# Patient Record
Sex: Male | Born: 1990 | Race: White | Hispanic: No | Marital: Single | Smoking: Current every day smoker
Health system: Southern US, Community
[De-identification: ages and names within clinical notes are randomized; demographics above are authoritative.]

## PROBLEM LIST (undated history)

## (undated) HISTORY — PX: APPENDECTOMY: SHX54

---
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
## 2012-04-13 ENCOUNTER — Emergency Department: Payer: Self-pay | Admitting: Emergency Medicine

## 2012-04-22 ENCOUNTER — Emergency Department: Payer: Self-pay | Admitting: Emergency Medicine

## 2012-05-25 ENCOUNTER — Emergency Department: Payer: Self-pay | Admitting: Emergency Medicine

## 2012-05-27 ENCOUNTER — Emergency Department: Payer: Self-pay | Admitting: Emergency Medicine

## 2012-12-10 ENCOUNTER — Emergency Department: Payer: Self-pay | Admitting: Emergency Medicine

## 2012-12-14 LAB — WOUND CULTURE

## 2013-08-21 ENCOUNTER — Emergency Department: Payer: Self-pay | Admitting: Emergency Medicine

## 2013-09-01 ENCOUNTER — Emergency Department: Payer: Self-pay | Admitting: Internal Medicine

## 2015-10-24 ENCOUNTER — Emergency Department
Admission: EM | Admit: 2015-10-24 | Discharge: 2015-10-24 | Disposition: A | Discharge status: Home / Self Care | Payer: Self-pay | Attending: Emergency Medicine | Admitting: Emergency Medicine

## 2015-10-24 ENCOUNTER — Encounter: Payer: Self-pay | Admitting: Emergency Medicine

## 2015-10-24 DIAGNOSIS — R55 Syncope and collapse: Secondary | ICD-10-CM | POA: Insufficient documentation

## 2015-10-24 DIAGNOSIS — F17210 Nicotine dependence, cigarettes, uncomplicated: Secondary | ICD-10-CM | POA: Insufficient documentation

## 2015-10-24 LAB — CBC
HEMATOCRIT: 48.2 % (ref 40.0–52.0)
Hemoglobin: 16.4 g/dL (ref 13.0–18.0)
MCH: 33.9 pg (ref 26.0–34.0)
MCHC: 34.1 g/dL (ref 32.0–36.0)
MCV: 99.4 fL (ref 80.0–100.0)
PLATELETS: 202 10*3/uL (ref 150–440)
RBC: 4.85 MIL/uL (ref 4.40–5.90)
RDW: 12.3 % (ref 11.5–14.5)
WBC: 9.4 10*3/uL (ref 3.8–10.6)

## 2015-10-24 LAB — BASIC METABOLIC PANEL
Anion gap: 9 (ref 5–15)
BUN: 9 mg/dL (ref 6–20)
CHLORIDE: 106 mmol/L (ref 101–111)
CO2: 25 mmol/L (ref 22–32)
CREATININE: 0.8 mg/dL (ref 0.61–1.24)
Calcium: 9.4 mg/dL (ref 8.9–10.3)
GFR calc non Af Amer: >60 mL/min (ref >60)
Glucose, Bld: 110 mg/dL — ABNORMAL HIGH (ref 65–99)
Potassium: 3.7 mmol/L (ref 3.5–5.1)
SODIUM: 140 mmol/L (ref 135–145)

## 2015-10-24 LAB — GLUCOSE, CAPILLARY: GLUCOSE-CAPILLARY: 129 mg/dL — AB (ref 65–99)

## 2015-10-24 MED ORDER — SODIUM CHLORIDE 0.9 % IV BOLUS (SEPSIS)
1000.0000 mL | Freq: Once | INTRAVENOUS | Status: AC
  Administered 2015-10-24: 1000 mL via INTRAVENOUS

## 2015-10-24 NOTE — ED Triage Notes (Signed)
Pt states that he was at work today and started having blackouts where he was consciously doing something and then woke up doing something else. Does not remember changing over to something else.  Denies N/V/D nor headache.  Feels that it may be some anxiety due to the fact that he is letting things bother him more now then he used to.

## 2015-10-24 NOTE — ED Notes (Addendum)
Patient A&Ox4.

## 2015-10-24 NOTE — ED Notes (Signed)
During triage while after trying to take blood, pt had an immediate onset of nausea. Eyes then rolled back into patient's head and he went out cold.  Hands drew up and pt's body started shaking with seizure-like activity.

## 2015-10-24 NOTE — ED Notes (Signed)
Able to obtain IV access with no complications. Patient denies any syncopal episodes during or immediatly after blood draw.

## 2015-10-24 NOTE — ED Provider Notes (Signed)
[facility] Emergency Department Provider Note   ____________________________________________   First MD Initiated Contact with Patient 10/24/15 1208     (approximate)  I have reviewed the triage vital signs and the nursing notes.   HISTORY  Chief Complaint Near Syncope    HPI Darren Hutchinson is a 25 y.o. male reports that while at work, he had an episode where he was standing and seemingly blacked out briefly, but he did not fall or injure himself. 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 Reports he felt like he was going to but did not completely pass out. He then felt better, but it forces never happened to him before prompting coming to the ER. We'll have his blood drawn he reported that he began having the same symptoms again, recently.  No headache, chest pain, fall or injury. No nausea or vomiting. Denies family history of sudden cardiac death. No leg swelling. No recent long trips or travels.   History reviewed. No pertinent past medical history.  There are no active problems to display for this patient.   Past Surgical History:  Procedure Laterality Date  . APPENDECTOMY      Prior to Admission medications   Not on File  None  Allergies Review of patient's allergies indicates no known allergies.  History reviewed. No pertinent family history.  Social History Social History  Substance Use Topics  . Smoking status: Current Every Day Smoker    Packs/day: 0.50    Types: Cigarettes  . Smokeless tobacco: Former Neurosurgeon  . Alcohol use 3.6 oz/week    6 Cans of beer per week     Comment: per day    Review of Systems Constitutional: No fever/chills Eyes: No visual changes. ENT: No sore  throat. Cardiovascular: Denies chest pain. Respiratory: Denies shortness of breath. Gastrointestinal: No abdominal pain.  No nausea, no vomiting.  No diarrhea.  No constipation. Genitourinary: Negative for dysuria. Musculoskeletal: Negative for back pain. Skin: Negative for rash. Neurological: Negative for headaches, focal weakness or numbness.  Patient reports he feels fine here now.  10-point ROS otherwise negative.  ____________________________________________   PHYSICAL EXAM:  VITAL SIGNS: ED Triage Vitals [10/24/15 1125]  Enc Vitals Group     BP (!) 148/99     Pulse Rate 96     Resp 16     Temp 98 F (36.7 C)     Temp src      SpO2 98 %     Weight 150 lb (68 kg)     Height 6' (1.829 m)     Head Circumference      Peak Flow      Pain Score      Pain Loc      Pain Edu?      Excl. in GC?     Constitutional: Alert and oriented. Well appearing and in no acute distress. Eyes: Conjunctivae are normal. PERRL. EOMI. Head: Atraumatic. Nose: No congestion/rhinnorhea. Mouth/Throat: Mucous membranes are moist.  Oropharynx non-erythematous. Neck: No stridor.   Cardiovascular: Normal rate, regular rhythm. Grossly normal heart sounds.  Good peripheral circulation. Respiratory: Normal respiratory effort.  No retractions. Lungs CTAB. Gastrointestinal: Soft and nontender. No distention. No abdominal bruits.  No CVA tenderness. Musculoskeletal: No lower extremity tenderness nor edema.  No joint effusions. Neurologic:  Normal speech and language. No gross focal neurologic deficits are appreciated. No gait instability. Skin:  Skin is warm, dry and intact. No rash noted. Psychiatric: Mood and affect are normal. Speech and behavior are normal.  ____________________________________________   LABS (all labs ordered are listed, but only abnormal results are displayed)  Labs Reviewed  BASIC METABOLIC PANEL  CBC  CBG MONITORING, ED    ____________________________________________  EKG  Reviewed and interpreted by me at 1150 Heart rate 70 Care is 100 QTc 420 Normal sinus rhythm, incomplete right bundle-branch block. No evidence of WPW, Brugada, or prolonged QT syndrome. ____________________________________________  RADIOLOGY   ____________________________________________   PROCEDURES  Procedure(s) performed: None  Procedures  Critical Care performed: No  ____________________________________________   INITIAL IMPRESSION / ASSESSMENT AND PLAN / ED COURSE  Pertinent labs & imaging results that were available during my care of the patient were reviewed by me and considered in my medical decision making (see chart for details).  Patient transfer evaluation of an episode of near-syncope. Also had a second episode while having blood drawn, brief witnessed with full recovery of mental status within a moment. No seizure activity, though did express a slight amount of shaking when he had almost passed out at triage. Completely awake and alert neurologically intact. History given seems indicated the patient may be slightly dehydrated, likely vasovagal given he stands for prolonged periods when this occurred. No evidence of acute cardiac or pulmonary disease. No evidence of arrhythmia or palpitations.  ----------------------------------------- 1:28 PM on 10/24/2015 -----------------------------------------  Discussed with the patient his results. He is currently sitting up, ambulating, feels well drinking fluids. Return precautions and treatment recommendations and follow-up discussed with the patient who is agreeable with the plan.   Clinical Course     ____________________________________________   FINAL CLINICAL IMPRESSION(S) / ED DIAGNOSES  Final diagnoses:  Vasovagal near-syncope      NEW MEDICATIONS STARTED DURING THIS VISIT:  New Prescriptions   No medications on file     Note:  This  document was prepared using Dragon voice recognition software and may include unintentional dictation errors.     Sharyn CreamerMark Jahzir Strohmeier, MD 10/24/15 1330

## 2015-10-24 NOTE — Discharge Instructions (Signed)

## 2018-10-22 ENCOUNTER — Emergency Department
Admission: EM | Admit: 2018-10-22 | Discharge: 2018-10-22 | Disposition: A | Discharge status: Home / Self Care | Payer: Self-pay | Attending: Student | Admitting: Student

## 2018-10-22 ENCOUNTER — Encounter: Payer: Self-pay | Admitting: Emergency Medicine

## 2018-10-22 ENCOUNTER — Other Ambulatory Visit: Payer: Self-pay

## 2018-10-22 ENCOUNTER — Emergency Department: Payer: Self-pay

## 2018-10-22 DIAGNOSIS — L0201 Cutaneous abscess of face: Secondary | ICD-10-CM | POA: Insufficient documentation

## 2018-10-22 DIAGNOSIS — F17210 Nicotine dependence, cigarettes, uncomplicated: Secondary | ICD-10-CM | POA: Insufficient documentation

## 2018-10-22 DIAGNOSIS — L0291 Cutaneous abscess, unspecified: Secondary | ICD-10-CM

## 2018-10-22 LAB — CBC WITH DIFFERENTIAL/PLATELET
Abs Immature Granulocytes: 0.02 10*3/uL (ref 0.00–0.07)
Basophils Absolute: 0.1 10*3/uL (ref 0.0–0.1)
Basophils Relative: 1 %
Eosinophils Absolute: 0.3 10*3/uL (ref 0.0–0.5)
Eosinophils Relative: 3 %
HCT: 47.3 % (ref 39.0–52.0)
Hemoglobin: 16.3 g/dL (ref 13.0–17.0)
Immature Granulocytes: 0 %
Lymphocytes Relative: 14 %
Lymphs Abs: 1.5 10*3/uL (ref 0.7–4.0)
MCH: 34.1 pg — ABNORMAL HIGH (ref 26.0–34.0)
MCHC: 34.5 g/dL (ref 30.0–36.0)
MCV: 99 fL (ref 80.0–100.0)
Monocytes Absolute: 0.7 10*3/uL (ref 0.1–1.0)
Monocytes Relative: 6 %
Neutro Abs: 8.1 10*3/uL — ABNORMAL HIGH (ref 1.7–7.7)
Neutrophils Relative %: 76 %
Platelets: 239 10*3/uL (ref 150–400)
RBC: 4.78 MIL/uL (ref 4.22–5.81)
RDW: 11.8 % (ref 11.5–15.5)
WBC: 10.6 10*3/uL — ABNORMAL HIGH (ref 4.0–10.5)
nRBC: 0 % (ref 0.0–0.2)

## 2018-10-22 LAB — COMPREHENSIVE METABOLIC PANEL
ALT: 19 U/L (ref 0–44)
AST: 22 U/L (ref 15–41)
Albumin: 5 g/dL (ref 3.5–5.0)
Alkaline Phosphatase: 73 U/L (ref 38–126)
Anion gap: 13 (ref 5–15)
BUN: 13 mg/dL (ref 6–20)
CO2: 24 mmol/L (ref 22–32)
Calcium: 10 mg/dL (ref 8.9–10.3)
Chloride: 105 mmol/L (ref 98–111)
Creatinine, Ser: 0.77 mg/dL (ref 0.61–1.24)
GFR calc Af Amer: >60 mL/min (ref >60)
GFR calc non Af Amer: >60 mL/min (ref >60)
Glucose, Bld: 105 mg/dL — ABNORMAL HIGH (ref 70–99)
Potassium: 3.9 mmol/L (ref 3.5–5.1)
Sodium: 142 mmol/L (ref 135–145)
Total Bilirubin: 1 mg/dL (ref 0.3–1.2)
Total Protein: 8.6 g/dL — ABNORMAL HIGH (ref 6.5–8.1)

## 2018-10-22 LAB — LACTIC ACID, PLASMA: Lactic Acid, Venous: 1.7 mmol/L (ref 0.5–1.9)

## 2018-10-22 MED ORDER — LIDOCAINE-EPINEPHRINE 2 %-1:100000 IJ SOLN
20.0000 mL | Freq: Once | INTRAMUSCULAR | Status: AC
  Administered 2018-10-22: 20 mL via INTRADERMAL
  Filled 2018-10-22: qty 1

## 2018-10-22 MED ORDER — SULFAMETHOXAZOLE-TRIMETHOPRIM 800-160 MG PO TABS: 1.0000 | ORAL_TABLET | Freq: Two times a day (BID) | ORAL | 0 refills | Status: AC

## 2018-10-22 MED ORDER — IOHEXOL 300 MG/ML  SOLN
75.0000 mL | Freq: Once | INTRAMUSCULAR | Status: AC | PRN
  Administered 2018-10-22: 75 mL via INTRAVENOUS

## 2018-10-22 MED ORDER — SULFAMETHOXAZOLE-TRIMETHOPRIM 800-160 MG PO TABS
1.0000 | ORAL_TABLET | Freq: Once | ORAL | Status: AC
  Administered 2018-10-22: 1 via ORAL
  Filled 2018-10-22: qty 1

## 2018-10-22 NOTE — Discharge Instructions (Addendum)
Thank you for letting us take care of you in the ER today.  Please leave the packing in for 48 hours. After this, let warm water run over it and pull out the packing.   Take your antibiotics as directed.  Please follow-up with your primary care doctor, in urgent care, or return here for reevaluation in 48 to 72 hours to make sure that your wound is healing appropriately.  Please return to the emergency department for any new or worsening symptoms.

## 2018-10-22 NOTE — ED Provider Notes (Signed)
[facility] Emergency Department Provider Note  ____________________________________________   First MD Initiated Contact with Patient 10/22/18 2157     (approximate)  I have reviewed the triage vital signs and the nursing notes.  History  Chief Complaint Abscess    HPI Darren Hutchinson is a 28 y.o. male who presents emergency department for an abscess to his left jawline.  He reports a history of abscesses requiring drainage in the past.  He is unsure of exactly how long it has been there for, he really only noticed it to be bothersome today.  He reports associated moderate pain.  He denies any difficulty speaking or swallowing.  There is been no drainage from the area.         Past Medical Hx History reviewed. No pertinent past medical history.  Problem List There are no active problems to display for this patient.   Past Surgical Hx Past Surgical History:  Procedure Laterality Date  . APPENDECTOMY      Medications Prior to Admission medications   Not on File    Allergies Patient has no known allergies.  Family Hx No family history on file.  Social Hx Social History   Tobacco Use  . Smoking status: Current Every Day Smoker    Packs/day: 0.50    Types: Cigarettes  . Smokeless tobacco: Former Network engineer Use Topics  . Alcohol use: Yes    Alcohol/week: 6.0 standard drinks    Types: 6 Cans of beer per week    Comment: per day  . Drug use: Yes    Frequency: 2.0 times per week    Types: Marijuana     Review of Systems  Constitutional: Negative for fever. Negative for chills. Eyes: Negative for visual changes. ENT: Negative for sore throat. Cardiovascular: Negative for chest pain. Respiratory: Negative for shortness of breath. Gastrointestinal: Negative for abdominal pain. Negative for nausea. Negative for vomiting. Genitourinary: Negative for dysuria. Musculoskeletal: Negative for leg swelling. Skin: + abscess  Neurological: Negative for for headaches.   Physical Exam  Vital Signs: ED Triage Vitals [10/22/18 1945]  Enc Vitals Group     BP (!) 188/100     Pulse Rate (!) 114     Resp 18     Temp 98.6 F (37 C)     Temp Source Oral     SpO2 100 %     Weight 150 lb (68 kg)     Height 6' (1.829 m)     Head Circumference      Peak Flow      Pain Score 4     Pain Loc      Pain Edu?      Excl. in [place]?     Constitutional: Alert and oriented.  Eyes: Conjunctivae clear. Sclera anicteric. Head: Normocephalic. Atraumatic. Nose: No congestion. No rhinorrhea. Mouth/Throat: Mucous membranes are moist. Poor dentition. Neck: ~ 2 x 3 cm very superficial abscess just below the angle of the mandible on the left.  Erythematous, fluctuant, warm, and tender to touch. Cardiovascular: Tachycardic, regular rhythm. . Extremities well perfused. Respiratory: Normal respiratory effort.   Gastrointestinal: No distention.  Musculoskeletal: No lower extremity edema. Neurologic:  Normal speech and language. No gross focal neurologic deficits are appreciated.  Skin: Skin is warm, dry and intact. No rash noted. Psychiatric: Mood and affect are appropriate for situation.  EKG  N/A    Radiology  CT: IMPRESSION: 1. Positive for a superficial 20 x 13 x  30 mm subcutaneous abscess, adjacent to but separate from the inferior pole of the left parotid. This overlies the left external jugular vein which remains patent. Nearby left mandible is intact. Reactive appearing left neck lymph nodes. 2. Acute bilateral paranasal sinus inflammation which appears unrelated to #1. 3. Carious dentition.   Procedures  Procedure(s) performed (including critical care):  Marland Kitchen.Marland Kitchen.Incision and Drainage  Date/Time: 10/23/2018 12:26 AM Performed by: Miguel AschoffMonks,  L., MD Authorized by: Miguel AschoffMonks,  L., MD   Consent:    Consent obtained:  Verbal   Consent given by:  Patient   Risks discussed:  Bleeding, incomplete drainage,  pain, infection and damage to other organs Location:    Type:  Abscess   Size:  2x3 cm   Location:  Head   Head/neck location: below angle of mandible. Pre-procedure details:    Skin preparation:  Betadine Anesthesia (see MAR for exact dosages):    Anesthesia method:  Local infiltration   Local anesthetic:  Lidocaine 1% WITH epi Procedure type:    Complexity:  Complex Procedure details:    Incision types:  Stab incision   Scalpel blade:  11   Wound management:  Irrigated with saline and extensive cleaning   Drainage:  Purulent   Drainage amount:  Copious   Packing materials:  1/4 in iodoform gauze Post-procedure details:    Patient tolerance of procedure:  Tolerated well, no immediate complications     Initial Impression / Assessment and Plan / ED Course  28 y.o. male who presents to the ED for abscess as above.  Imaging consistent with superficial abscess, separate from parotid and no involvement of the external jugular vein.  We will plan for careful I&D and course of oral antibiotics.  Patient is agreeable.  Patient tolerated I&D well, see procedure note.  Packing placed, with instructions for removal in 48 hours.  Discussed wound care as well as return precautions.  Advise recheck in 48 to 72 hours.  Course of oral antibiotics.  Patient voices understanding is comfortable plan and discharge.   Final Clinical Impression(s) / ED Diagnosis  Final diagnoses:  Abscess       Note:  This document was prepared using Dragon voice recognition software and may include unintentional dictation errors.   Miguel AschoffMonks,  L., MD 10/23/18 [id number]

## 2018-10-22 NOTE — ED Triage Notes (Signed)
Patient ambulatory to triage with steady gait, without difficulty or distress noted, mask in place; pt reports since Tuesday has had abscess to left jawline/neck by beard hairline; st hx of same

## 2018-10-27 LAB — CULTURE, BLOOD (ROUTINE X 2)
Culture: NO GROWTH
Culture: NO GROWTH
Special Requests: ADEQUATE
Special Requests: ADEQUATE

## 2019-07-12 ENCOUNTER — Emergency Department
Admission: EM | Admit: 2019-07-12 | Discharge: 2019-07-12 | Disposition: A | Discharge status: Home / Self Care | Payer: Self-pay | Attending: Student in an Organized Health Care Education/Training Program | Admitting: Student in an Organized Health Care Education/Training Program

## 2019-07-12 ENCOUNTER — Encounter: Payer: Self-pay | Admitting: Emergency Medicine

## 2019-07-12 ENCOUNTER — Other Ambulatory Visit: Payer: Self-pay

## 2019-07-12 ENCOUNTER — Emergency Department: Payer: Self-pay

## 2019-07-12 DIAGNOSIS — S6991XA Unspecified injury of right wrist, hand and finger(s), initial encounter: Secondary | ICD-10-CM | POA: Insufficient documentation

## 2019-07-12 DIAGNOSIS — W2209XA Striking against other stationary object, initial encounter: Secondary | ICD-10-CM | POA: Insufficient documentation

## 2019-07-12 DIAGNOSIS — Y939 Activity, unspecified: Secondary | ICD-10-CM | POA: Insufficient documentation

## 2019-07-12 DIAGNOSIS — Y929 Unspecified place or not applicable: Secondary | ICD-10-CM | POA: Insufficient documentation

## 2019-07-12 DIAGNOSIS — F17210 Nicotine dependence, cigarettes, uncomplicated: Secondary | ICD-10-CM | POA: Insufficient documentation

## 2019-07-12 DIAGNOSIS — Y999 Unspecified external cause status: Secondary | ICD-10-CM | POA: Insufficient documentation

## 2019-07-12 MED ORDER — IBUPROFEN 600 MG PO TABS
600.0000 mg | ORAL_TABLET | Freq: Four times a day (QID) | ORAL | 0 refills | Status: DC | PRN
Start: 2019-07-12 — End: 2020-05-29

## 2019-07-12 NOTE — ED Triage Notes (Signed)
Right hand injury after punching

## 2019-07-12 NOTE — ED Provider Notes (Signed)
[facility] Emergency Department Provider Note  ____________________________________________  Time seen: Approximately 2:43 PM  I have reviewed the triage vital signs and the nursing notes.   HISTORY  Chief Complaint Hand Injury    HPI Darren Hutchinson is a 29 y.o. male that presents to the emergency department for evaluation of right hand pain after punching a wall last night.  No additional injuries.  He has broken his hand before.   History reviewed. No pertinent past medical history.  There are no problems to display for this patient.   Past Surgical History:  Procedure Laterality Date  . APPENDECTOMY      Prior to Admission medications   Medication Sig Start Date End Date Taking? Authorizing Provider  ibuprofen (ADVIL) 600 MG tablet Take 1 tablet (600 mg total) by mouth every 6 (six) hours as needed. 07/12/19   Enid Derry, PA-C    Allergies Patient has no known allergies.  No family history on file.  Social History Social History   Tobacco Use  . Smoking status: Current Every Day Smoker    Packs/day: 0.50    Types: Cigarettes  . Smokeless tobacco: Former Engineer, water Use Topics  . Alcohol use: Yes    Alcohol/week: 6.0 standard drinks    Types: 6 Cans of beer per week    Comment: per day  . Drug use: Yes    Frequency: 2.0 times per week    Types: Marijuana     Review of Systems  Respiratory: No SOB. Gastrointestinal: No abdominal pain.  No nausea, no vomiting.  Musculoskeletal: Positive for hand pain. Skin: Negative for rash, abrasions, lacerations, ecchymosis. Neurological: Negative for headaches, numbness or tingling   ____________________________________________   PHYSICAL EXAM:  VITAL SIGNS: ED Triage Vitals  Enc Vitals Group     BP 07/12/19 1353 122/70     Pulse Rate 07/12/19 1353 78     Resp 07/12/19 1353 18     Temp 07/12/19 1353 98 F (36.7 C)     Temp Source 07/12/19 1353 Oral     SpO2 07/12/19 1353  99 %     Weight 07/12/19 1340 149 lb 14.6 oz (68 kg)     Height 07/12/19 1340 6' (1.829 m)     Head Circumference --      Peak Flow --      Pain Score 07/12/19 1340 8     Pain Loc --      Pain Edu? --      Excl. in GC? --      Constitutional: Alert and oriented. Well appearing and in no acute distress. Eyes: Conjunctivae are normal. PERRL. EOMI. Head: Atraumatic. ENT:      Ears:      Nose: No congestion/rhinnorhea.      Mouth/Throat: Mucous membranes are moist.  Neck: No stridor.  Cardiovascular: Normal rate, regular rhythm.  Good peripheral circulation.  Symmetric radial pulses bilaterally. Respiratory: Normal respiratory effort without tachypnea or retractions. Lungs CTAB. Good air entry to the bases with no decreased or absent breath sounds. Musculoskeletal: Full range of motion to all extremities. No gross deformities appreciated.  Mild swelling to right dorsal hand.  Full range of motion of fingers. Neurologic:  Normal speech and language. No gross focal neurologic deficits are appreciated.  Skin:  Skin is warm, dry and intact. No rash noted. Psychiatric: Mood and affect are normal. Speech and behavior are normal. Patient exhibits appropriate insight and judgement.   ____________________________________________  LABS (all labs ordered are listed, but only abnormal results are displayed)  Labs Reviewed - No data to display ____________________________________________  EKG   ____________________________________________  RADIOLOGY Robinette Haines, personally viewed and evaluated these images (plain radiographs) as part of my medical decision making, as well as reviewing the written report by the radiologist.  DG Hand Complete Right  Result Date: 07/12/2019 CLINICAL DATA:  Pain after hitting metal pole EXAM: RIGHT HAND - COMPLETE 3+ VIEW COMPARISON:  None. FINDINGS: Frontal, oblique, and lateral views were obtained. There is an old healed fracture of the fifth  metacarpal with remodeling. No acute fracture or dislocation. Joint spaces appear normal. No erosive change. IMPRESSION: Prior fracture of the distal fifth metacarpal, healed with remodeling. No acute fracture or dislocation. Joint spaces appear normal. No erosive change. Electronically Signed   By: Lowella Grip III M.D.   On: 07/12/2019 14:31    ____________________________________________    PROCEDURES  Procedure(s) performed:    Procedures    Medications - No data to display   ____________________________________________   INITIAL IMPRESSION / ASSESSMENT AND PLAN / ED COURSE  Pertinent labs & imaging results that were available during my care of the patient were reviewed by me and considered in my medical decision making (see chart for details).  Review of the [place] CSRS was performed in accordance of the [place] prior to dispensing any controlled drugs.     Patient presented to emergency department for evaluation of hand injury.  Vital signs and exam are reassuring.  X-ray negative for acute bony abnormalities.  Wrist splint was given.  Patient left prior to discharge.    Darren Hutchinson was evaluated in Emergency Department on 07/12/2019 for the symptoms described in the history of present illness. He was evaluated in the context of the global COVID-19 pandemic, which necessitated consideration that the patient might be at risk for infection with the SARS-CoV-2 virus that causes COVID-19. Institutional protocols and algorithms that pertain to the evaluation of patients at risk for COVID-19 are in a state of rapid change based on information released by regulatory bodies including the CDC and federal and state organizations. These policies and algorithms were followed during the patient's care in the ED.   ____________________________________________  FINAL CLINICAL IMPRESSION(S) / ED DIAGNOSES  Final diagnoses:  Injury of right hand, initial encounter      NEW  MEDICATIONS STARTED DURING THIS VISIT:  ED Discharge Orders         Ordered    ibuprofen (ADVIL) 600 MG tablet  Every 6 hours PRN     07/12/19 1503              This chart was dictated using voice recognition software/Dragon. Despite best efforts to proofread, errors can occur which can change the meaning. Any change was purely unintentional.    Laban Emperor, PA-C 07/12/19 1551    Merlyn Lot, MD 07/13/19 [phone]

## 2019-07-12 NOTE — ED Notes (Signed)
Registration told this RN that pt walked out of the room and walked towards the lobby.

## 2019-07-12 NOTE — ED Notes (Signed)
Pt has not returned to room. Pt left prior to signing for d/c.

## 2020-05-29 ENCOUNTER — Emergency Department
Admission: EM | Admit: 2020-05-29 | Discharge: 2020-05-29 | Disposition: A | Discharge status: Home / Self Care | Payer: Self-pay | Attending: Emergency Medicine | Admitting: Emergency Medicine

## 2020-05-29 ENCOUNTER — Other Ambulatory Visit: Payer: Self-pay

## 2020-05-29 ENCOUNTER — Emergency Department: Payer: Self-pay

## 2020-05-29 DIAGNOSIS — W1830XA Fall on same level, unspecified, initial encounter: Secondary | ICD-10-CM | POA: Insufficient documentation

## 2020-05-29 DIAGNOSIS — F17210 Nicotine dependence, cigarettes, uncomplicated: Secondary | ICD-10-CM | POA: Insufficient documentation

## 2020-05-29 DIAGNOSIS — M79602 Pain in left arm: Secondary | ICD-10-CM

## 2020-05-29 DIAGNOSIS — S46812A Strain of other muscles, fascia and tendons at shoulder and upper arm level, left arm, initial encounter: Secondary | ICD-10-CM | POA: Insufficient documentation

## 2020-05-29 DIAGNOSIS — S46819A Strain of other muscles, fascia and tendons at shoulder and upper arm level, unspecified arm, initial encounter: Secondary | ICD-10-CM

## 2020-05-29 DIAGNOSIS — Y9353 Activity, golf: Secondary | ICD-10-CM | POA: Insufficient documentation

## 2020-05-29 MED ORDER — NAPROXEN 500 MG PO TABS
500.0000 mg | ORAL_TABLET | Freq: Once | ORAL | Status: AC
  Administered 2020-05-29: 500 mg via ORAL
  Filled 2020-05-29: qty 1

## 2020-05-29 MED ORDER — ACETAMINOPHEN 500 MG PO TABS
1000.0000 mg | ORAL_TABLET | Freq: Once | ORAL | Status: AC
  Administered 2020-05-29: 1000 mg via ORAL
  Filled 2020-05-29: qty 2

## 2020-05-29 MED ORDER — LIDOCAINE 5 % EX PTCH
1.0000 | MEDICATED_PATCH | CUTANEOUS | Status: DC
  Administered 2020-05-29: 1 via TRANSDERMAL
  Filled 2020-05-29: qty 1

## 2020-05-29 NOTE — ED Provider Notes (Signed)
[facility] Emergency Department Provider Note  ____________________________________________   Event Date/Time   First MD Initiated Contact with Patient 05/29/20 1554     (approximate)  I have reviewed the triage vital signs and the nursing notes.   HISTORY  Chief Complaint Arm Injury   HPI Darren Hutchinson is a 30 y.o. male without significant past medical history who presents for assessment of some bilateral shoulder pain worse on the left than the right rating down his left arm that he said began yesterday when he fell falling onto his left arm.  He denies hitting his head or any LOC.  Does not have any midline neck pain back pain chest pain, abdominal pain rash or other extremity pain.  No recent cough, fevers, chills, vomiting, diarrhea, dysuria, rash, vision changes or other recent traumatic injuries or falls.  He has not taken any medications for his pain.  He did try a sling from a family member to see if it helped but is not significant help his pain.  No other acute concerns at this time.         History reviewed. No pertinent past medical history.  There are no problems to display for this patient.   Past Surgical History:  Procedure Laterality Date  . APPENDECTOMY      Prior to Admission medications   Not on File    Allergies Patient has no known allergies.  No family history on file.  Social History Social History   Tobacco Use  . Smoking status: Current Every Day Smoker    Packs/day: 0.50    Types: Cigarettes  . Smokeless tobacco: Former Clinical biochemist  . Vaping Use: Never used  Substance Use Topics  . Alcohol use: Yes    Alcohol/week: 6.0 standard drinks    Types: 6 Cans of beer per week    Comment: per day  . Drug use: Yes    Frequency: 2.0 times per week    Types: Marijuana    Review of Systems  Review of Systems  Constitutional: Negative for chills and fever.  HENT: Negative for sore throat.   Eyes:  Negative for pain.  Respiratory: Negative for cough and stridor.   Cardiovascular: Negative for chest pain.  Gastrointestinal: Negative for vomiting.  Genitourinary: Negative for dysuria.  Musculoskeletal: Positive for joint pain ( L shoulder) and myalgias ( L shoulder, R shoulder).  Skin: Negative for rash.  Neurological: Negative for seizures, loss of consciousness and headaches.  Psychiatric/Behavioral: Negative for suicidal ideas.  All other systems reviewed and are negative.     ____________________________________________   PHYSICAL EXAM:  VITAL SIGNS: ED Triage Vitals [05/29/20 1555]  Enc Vitals Group     BP      Pulse      Resp      Temp      Temp src      SpO2      Weight      Height      Head Circumference      Peak Flow      Pain Score 4     Pain Loc      Pain Edu?      Excl. in GC?    Vitals:   05/29/20 1556  BP: (!) 147/89  Pulse: 99  Resp: 18  Temp: 97.9 F (36.6 C)  SpO2: 99%   Physical Exam Vitals and nursing note reviewed.  Constitutional:      Appearance:  He is well-developed.  HENT:     Head: Normocephalic and atraumatic.     Right Ear: External ear normal.     Left Ear: External ear normal.     Nose: Nose normal.     Mouth/Throat:     Mouth: Mucous membranes are moist.  Eyes:     Conjunctiva/sclera: Conjunctivae normal.  Cardiovascular:     Rate and Rhythm: Normal rate and regular rhythm.     Heart sounds: No murmur heard.   Pulmonary:     Effort: Pulmonary effort is normal. No respiratory distress.     Breath sounds: Normal breath sounds.  Abdominal:     Palpations: Abdomen is soft.     Tenderness: There is no abdominal tenderness.  Musculoskeletal:     Cervical back: Neck supple.  Skin:    General: Skin is warm and dry.     Capillary Refill: Capillary refill takes less than 2 seconds.  Neurological:     Mental Status: He is alert and oriented to person, place, and time.  Psychiatric:        Mood and Affect: Mood  normal.     No tenderness step-offs deformities over the C/T/L-spine.  There is tenderness over the bilateral trapezius muscles and some tenderness over the left posterior shoulder joint and over the [facility] joint and the axilla without any numbness effusion or deformity.  Patient is able to raise his arm past 90 degrees albeit with some pain.  He has no limitation range of motion deformity effusion at the left elbow or wrist.  2+ bilateral radial pulses.  Sensation intact in the distribution of the radial and median nerves in the bilateral upper extremities. ____________________________________________   LABS (all labs ordered are listed, but only abnormal results are displayed)  Labs Reviewed - No data to display ____________________________________________  EKG ____________________________________________  RADIOLOGY  ED MD interpretation: No fracture or dislocation.  Official radiology report(s): DG Shoulder Left  Result Date: 05/29/2020 CLINICAL DATA:  Left arm injury Left shoulder pain EXAM: LEFT SHOULDER - 2+ VIEW COMPARISON:  None. FINDINGS: There is no evidence of fracture or dislocation. There is no evidence of arthropathy or other focal bone abnormality. Soft tissues are unremarkable. IMPRESSION: Negative. Electronically Signed   By: Acquanetta Belling M.D.   On: 05/29/2020 16:28    ____________________________________________   PROCEDURES  Procedure(s) performed (including Critical Care):  Procedures   ____________________________________________   INITIAL IMPRESSION / ASSESSMENT AND PLAN / ED COURSE      Patient presents with above to history exam for assessment of some bilateral shoulder and left elbow pain worse in the left shoulder rating down to the left elbow that started yesterday after he had a fall.  No other associated pain.  He is afebrile and hypertensive with otherwise stable vital signs on arrival.  He is neurovascularly intact in all extremities although does  have some bilateral trapezius pain and around the left shoulder including over the [facility] joint although no significant widening.  He is otherwise neurovascularly intact.  No evidence on history exam of acute infectious process and low suspicion for any significant acute spinal injury or thoracic injury.  Plain film left shoulder is unremarkable.  Patient given below noted analgesia and stated felt little better on reassessment.  Given stable vitals with otherwise reassuring exam work-up I believe patient safe for discharge with outpatient follow-up.  Discharged stable condition.  Strict return precautions advised and discussed.        ____________________________________________  FINAL CLINICAL IMPRESSION(S) / ED DIAGNOSES  Final diagnoses:  Left arm pain  Strain of trapezius muscle, unspecified laterality, initial encounter    Medications  lidocaine (LIDODERM) 5 % 1 patch (1 patch Transdermal Patch Applied 05/29/20 1609)  acetaminophen (TYLENOL) tablet 1,000 mg (1,000 mg Oral Given 05/29/20 1609)  naproxen (NAPROSYN) tablet 500 mg (500 mg Oral Given 05/29/20 1609)     ED Discharge Orders    None       Note:  This document was prepared using Dragon voice recognition software and may include unintentional dictation errors.   Gilles Chiquito, MD 05/29/20 [phone]

## 2020-05-29 NOTE — ED Triage Notes (Signed)
Pt comes with c/o left arm injury that occurred yesterday while playing disc golf. Pt states pain to left arm and shoulder.

## 2020-05-29 NOTE — ED Notes (Signed)
See triage note  Presents with pain to left shoulder  States he was playing disc golf yesterday  Fell  Landing on shoulder  No deformity noted  Increased pain with movement   Good pulses

## 2021-11-12 IMAGING — DX DG HAND COMPLETE 3+V*R*
3 series · 3 of 3 positions shown · non-contrast
Comparison: None.

CLINICAL DATA: Pain after hitting metal pole

EXAM:
RIGHT HAND - COMPLETE 3+ VIEW

[hand ap]
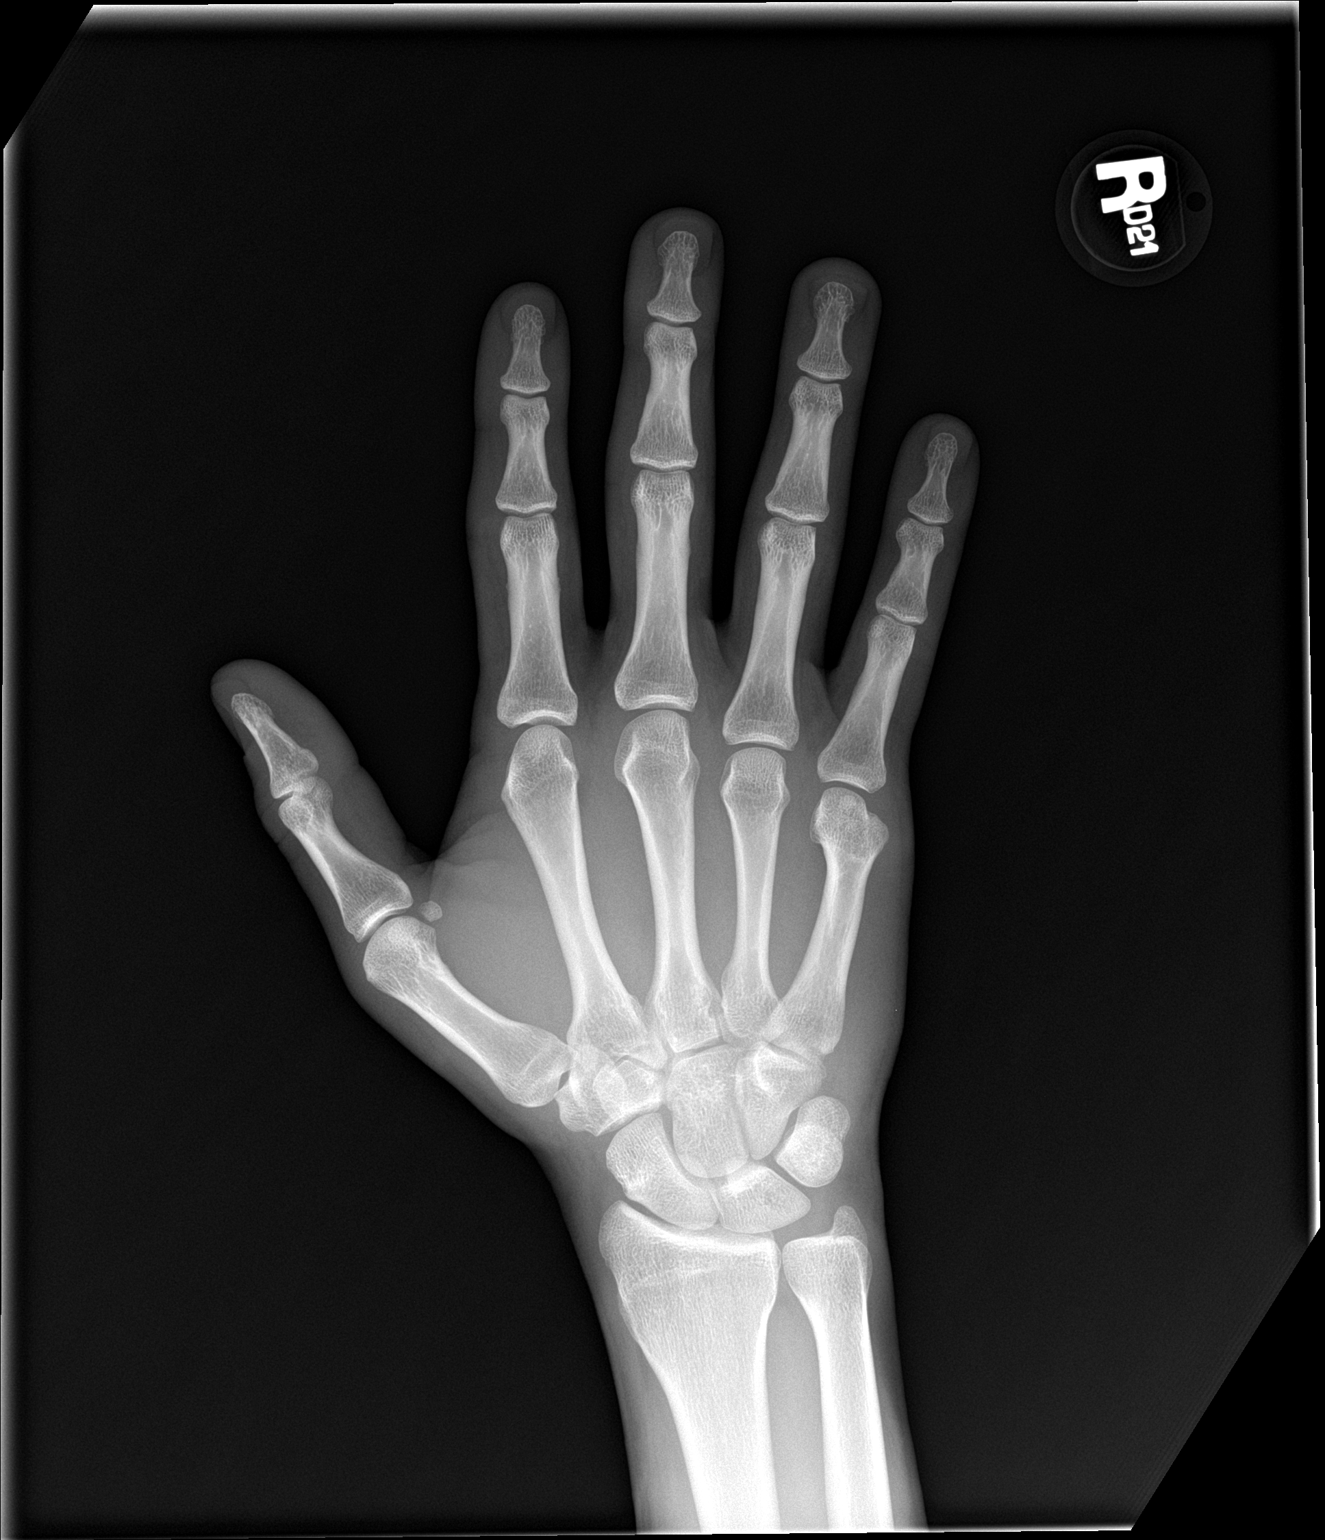

[hand obl]
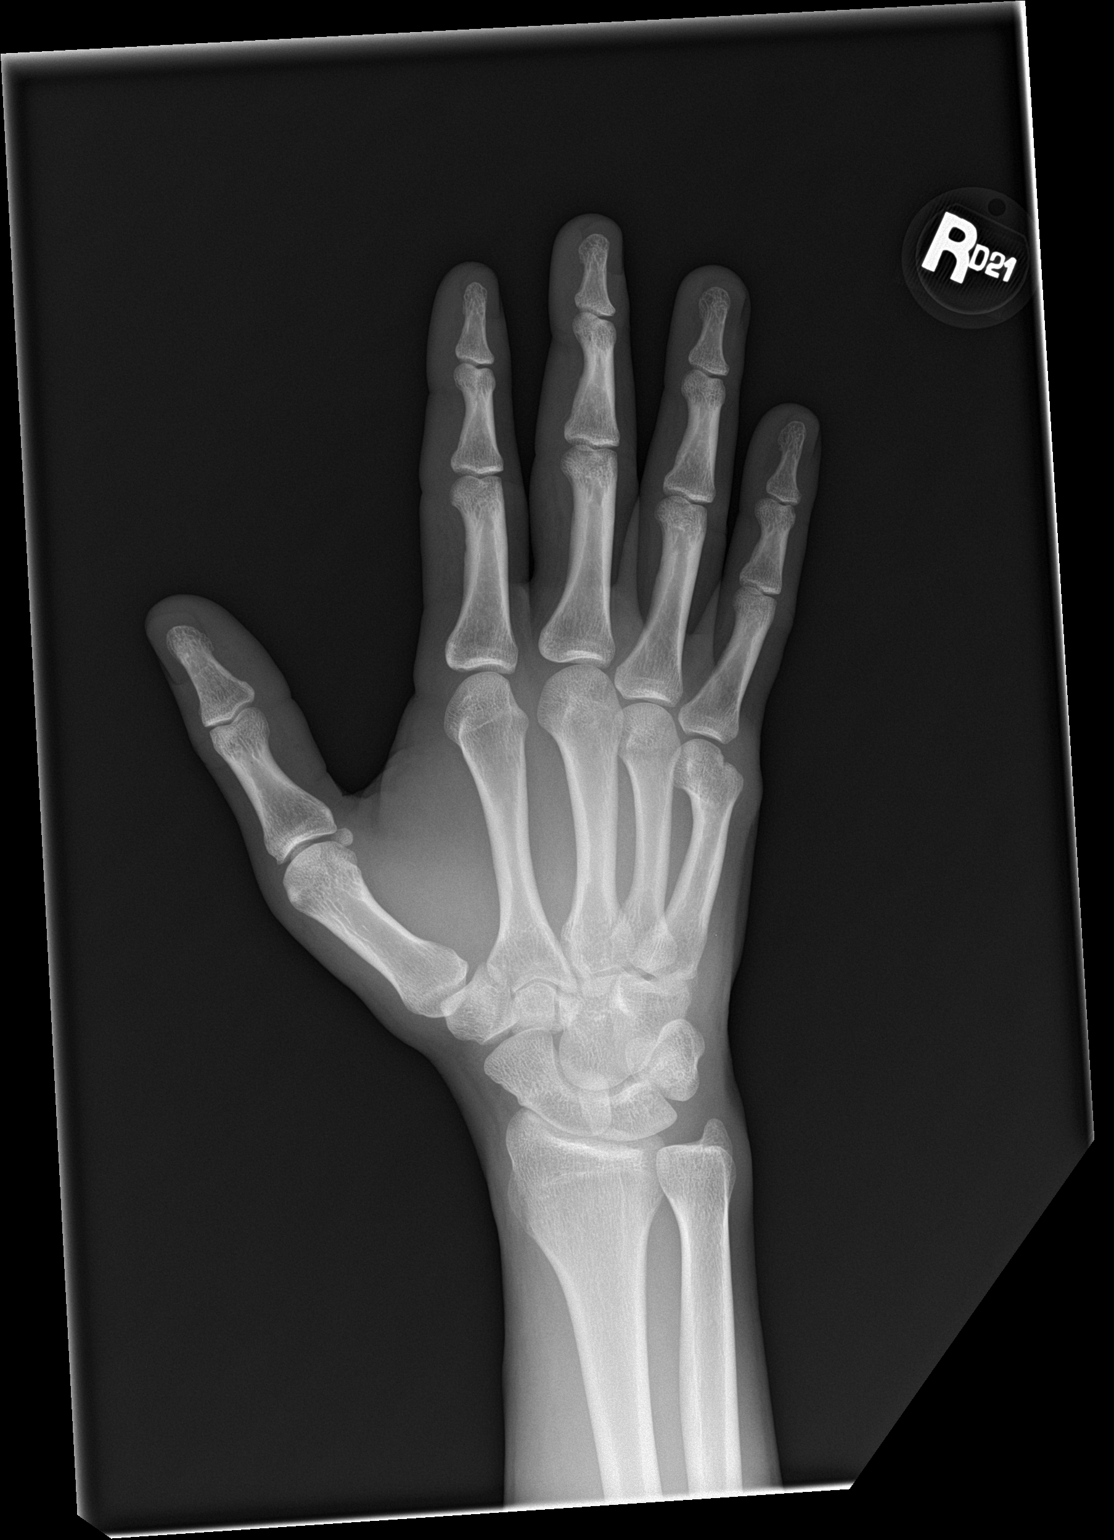

[hand lat]
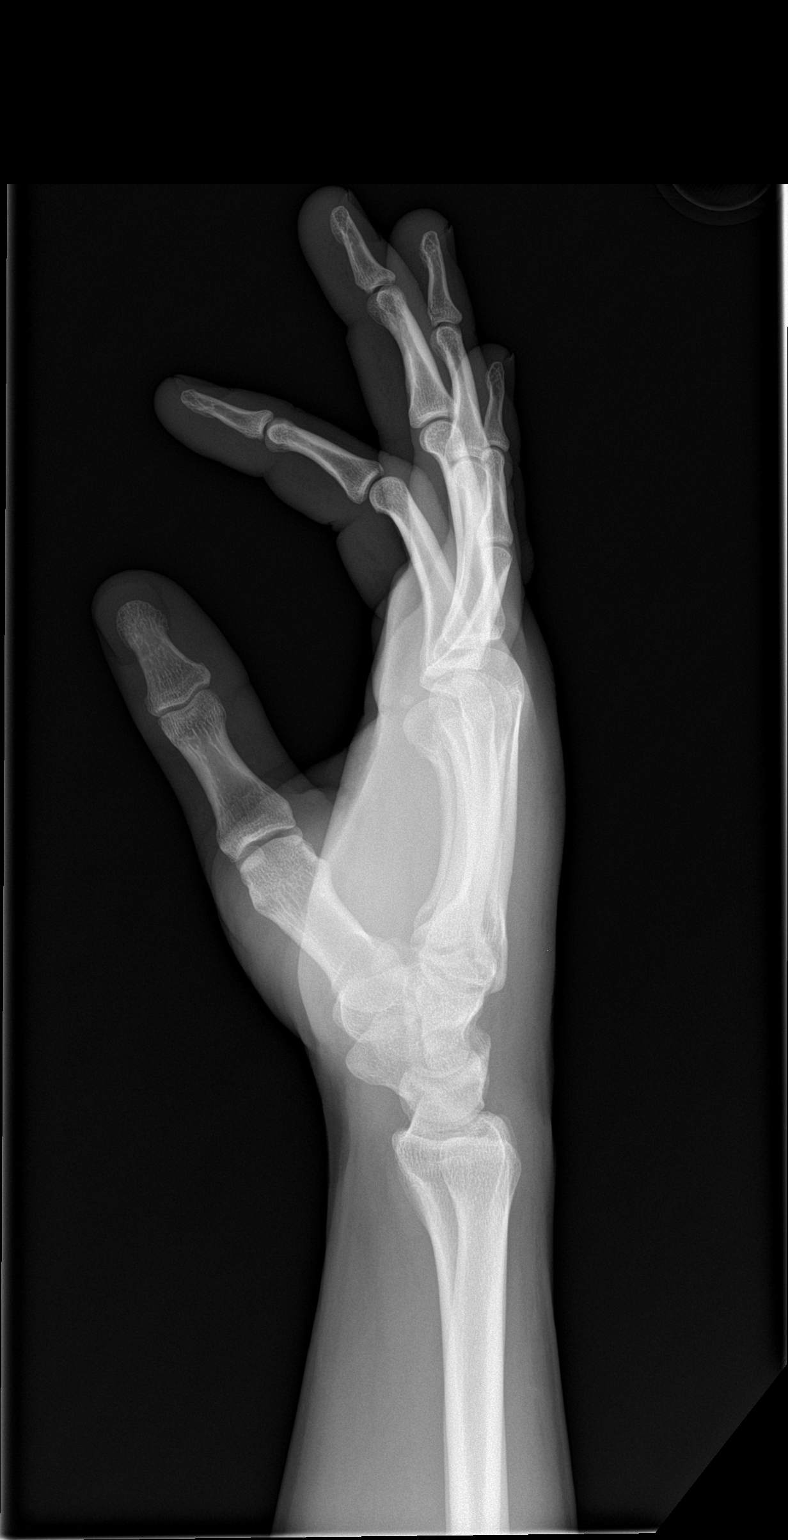

[3 of 3 positions shown; findings below may reference images not displayed]

FINDINGS: Frontal, oblique, and lateral views were obtained. There is an old
healed fracture of the fifth metacarpal with remodeling. No acute
fracture or dislocation. Joint spaces appear normal. No erosive
change.
IMPRESSION: Prior fracture of the distal fifth metacarpal, healed with
remodeling. No acute fracture or dislocation. Joint spaces appear
normal. No erosive change.

## 2023-02-22 ENCOUNTER — Inpatient Hospital Stay
Admission: EM | Admit: 2023-02-22 | Discharge: 2023-02-24 | DRG: 603 | Disposition: A | Discharge status: Home / Self Care | Payer: Self-pay | Attending: Internal Medicine | Admitting: Internal Medicine

## 2023-02-22 ENCOUNTER — Other Ambulatory Visit: Payer: Self-pay

## 2023-02-22 ENCOUNTER — Emergency Department: Payer: Self-pay

## 2023-02-22 DIAGNOSIS — R6884 Jaw pain: Secondary | ICD-10-CM | POA: Diagnosis present

## 2023-02-22 DIAGNOSIS — R6510 Systemic inflammatory response syndrome (SIRS) of non-infectious origin without acute organ dysfunction: Secondary | ICD-10-CM | POA: Diagnosis present

## 2023-02-22 DIAGNOSIS — K029 Dental caries, unspecified: Secondary | ICD-10-CM | POA: Diagnosis present

## 2023-02-22 DIAGNOSIS — E876 Hypokalemia: Secondary | ICD-10-CM | POA: Diagnosis present

## 2023-02-22 DIAGNOSIS — L03211 Cellulitis of face: Principal | ICD-10-CM

## 2023-02-22 DIAGNOSIS — F17210 Nicotine dependence, cigarettes, uncomplicated: Secondary | ICD-10-CM | POA: Diagnosis present

## 2023-02-22 DIAGNOSIS — K047 Periapical abscess without sinus: Secondary | ICD-10-CM | POA: Diagnosis present

## 2023-02-22 LAB — CBC WITH DIFFERENTIAL/PLATELET
Abs Immature Granulocytes: 0.11 10*3/uL — ABNORMAL HIGH (ref 0.00–0.07)
Basophils Absolute: 0.1 10*3/uL (ref 0.0–0.1)
Basophils Relative: 0 %
Eosinophils Absolute: 0.2 10*3/uL (ref 0.0–0.5)
Eosinophils Relative: 1 %
HCT: 41 % (ref 39.0–52.0)
Hemoglobin: 14.7 g/dL (ref 13.0–17.0)
Immature Granulocytes: 1 %
Lymphocytes Relative: 5 %
Lymphs Abs: 0.9 10*3/uL (ref 0.7–4.0)
MCH: 35 pg — ABNORMAL HIGH (ref 26.0–34.0)
MCHC: 35.9 g/dL (ref 30.0–36.0)
MCV: 97.6 fL (ref 80.0–100.0)
Monocytes Absolute: 0.8 10*3/uL (ref 0.1–1.0)
Monocytes Relative: 5 %
Neutro Abs: 16.4 10*3/uL — ABNORMAL HIGH (ref 1.7–7.7)
Neutrophils Relative %: 88 %
Platelets: 203 10*3/uL (ref 150–400)
RBC: 4.2 MIL/uL — ABNORMAL LOW (ref 4.22–5.81)
RDW: 11.6 % (ref 11.5–15.5)
WBC: 18.5 10*3/uL — ABNORMAL HIGH (ref 4.0–10.5)
nRBC: 0 % (ref 0.0–0.2)

## 2023-02-22 LAB — BASIC METABOLIC PANEL
Anion gap: 13 (ref 5–15)
BUN: 8 mg/dL (ref 6–20)
CO2: 23 mmol/L (ref 22–32)
Calcium: 9.2 mg/dL (ref 8.9–10.3)
Chloride: 98 mmol/L (ref 98–111)
Creatinine, Ser: 0.7 mg/dL (ref 0.61–1.24)
GFR, Estimated: >60 mL/min (ref >60)
Glucose, Bld: 118 mg/dL — ABNORMAL HIGH (ref 70–99)
Potassium: 3.2 mmol/L — ABNORMAL LOW (ref 3.5–5.1)
Sodium: 134 mmol/L — ABNORMAL LOW (ref 135–145)

## 2023-02-22 MED ORDER — ACETAMINOPHEN 650 MG RE SUPP: 650.0000 mg | Freq: Four times a day (QID) | RECTAL | Status: DC | PRN

## 2023-02-22 MED ORDER — DEXAMETHASONE SODIUM PHOSPHATE 10 MG/ML IJ SOLN
8.0000 mg | Freq: Three times a day (TID) | INTRAMUSCULAR | Status: DC
  Administered 2023-02-23 – 2023-02-24 (×4): 8 mg via INTRAVENOUS
  Filled 2023-02-22 (×5): qty 0.8

## 2023-02-22 MED ORDER — KETOROLAC TROMETHAMINE 30 MG/ML IJ SOLN: 30.0000 mg | Freq: Four times a day (QID) | INTRAMUSCULAR | Status: DC | PRN

## 2023-02-22 MED ORDER — MORPHINE SULFATE (PF) 2 MG/ML IV SOLN: 2.0000 mg | INTRAVENOUS | Status: DC | PRN

## 2023-02-22 MED ORDER — DEXAMETHASONE SODIUM PHOSPHATE 10 MG/ML IJ SOLN
6.0000 mg | Freq: Once | INTRAMUSCULAR | Status: AC
  Administered 2023-02-22: 6 mg via INTRAVENOUS
  Filled 2023-02-22: qty 1

## 2023-02-22 MED ORDER — ONDANSETRON HCL 4 MG PO TABS: 4.0000 mg | ORAL_TABLET | Freq: Four times a day (QID) | ORAL | Status: DC | PRN

## 2023-02-22 MED ORDER — ACETAMINOPHEN 325 MG PO TABS: 650.0000 mg | ORAL_TABLET | Freq: Four times a day (QID) | ORAL | Status: DC | PRN

## 2023-02-22 MED ORDER — ONDANSETRON HCL 4 MG/2ML IJ SOLN: 4.0000 mg | Freq: Four times a day (QID) | INTRAMUSCULAR | Status: DC | PRN

## 2023-02-22 MED ORDER — IOHEXOL 300 MG/ML  SOLN
100.0000 mL | Freq: Once | INTRAMUSCULAR | Status: AC | PRN
  Administered 2023-02-22: 100 mL via INTRAVENOUS

## 2023-02-22 MED ORDER — SODIUM CHLORIDE 0.9 % IV SOLN
3.0000 g | Freq: Once | INTRAVENOUS | Status: AC
  Administered 2023-02-22: 3 g via INTRAVENOUS
  Filled 2023-02-22: qty 8

## 2023-02-22 MED ORDER — TRAMADOL HCL 50 MG PO TABS
50.0000 mg | ORAL_TABLET | Freq: Once | ORAL | Status: AC
  Administered 2023-02-22: 50 mg via ORAL
  Filled 2023-02-22: qty 1

## 2023-02-22 MED ORDER — SODIUM CHLORIDE 0.9 % IV BOLUS
1000.0000 mL | Freq: Once | INTRAVENOUS | Status: AC
  Administered 2023-02-22: 1000 mL via INTRAVENOUS

## 2023-02-22 MED ORDER — HYDROCODONE-ACETAMINOPHEN 5-325 MG PO TABS
1.0000 | ORAL_TABLET | ORAL | Status: DC | PRN
Start: 2023-02-22 — End: 2023-02-23
  Administered 2023-02-23 (×2): 1 via ORAL
  Filled 2023-02-22 (×2): qty 1

## 2023-02-22 MED ORDER — SODIUM CHLORIDE 0.9 % IV SOLN
1.5000 g | Freq: Once | INTRAVENOUS | Status: DC
  Filled 2023-02-22: qty 4

## 2023-02-22 MED ORDER — SODIUM CHLORIDE 0.9 % IV SOLN
3.0000 g | Freq: Four times a day (QID) | INTRAVENOUS | Status: DC
  Administered 2023-02-23 – 2023-02-24 (×5): 3 g via INTRAVENOUS
  Filled 2023-02-22 (×7): qty 8

## 2023-02-22 NOTE — H&P (Signed)
History and Physical    Patient: Darren Hutchinson EXB:[id number] DOB: 1990/08/13 DOA: 02/22/2023 DOS: the patient was seen and examined on 02/22/2023 PCP: Pcp, No  Patient coming from: Home  Chief Complaint:  Chief Complaint  Patient presents with   Jaw Pain   Dental Pain    HPI: Darren Hutchinson is a 33 y.o. male with medical history significant for Dental caries and prior dental abscesses who presents to the ED with a 4-day history of progressively worsening pain in the left jaw associated with with left-sided facial swelling.  It has become increasingly difficult to open his mouth due to pain.  Denies difficulty swallowing or drooling.  Little help with ibuprofen.  Has subjective fevers. ED course and Data review: Tachycardic to 120, afebrile, BP 151/111 Labs notable for WBC 18,000, lactic acid pending, mild hypokalemia of 3.2 Maxillofacial CT showing cellulitis without abscess as follows: IMPRESSION: 1. Bilateral lower facial subcutaneous induration with thickening of the platysma and bilateral level 1A lymph nodes, consistent with cellulitis. No abscess. 2. Caries of the most posterior remaining mandibular and maxillary molars on the left. 3. Diffuse moderate paranasal sinus opacification, sparing the sphenoid sinuses.  Patient started on Unasyn and Decadron and given a fluid bolus Hospitalist consulted for admission.   Review of Systems: As mentioned in the history of present illness. All other systems reviewed and are negative.  No past medical history on file. Past Surgical History:  Procedure Laterality Date   APPENDECTOMY     Social History:  reports that he has been smoking cigarettes. He has quit using smokeless tobacco. He reports current alcohol use of about 6.0 standard drinks of alcohol per week. He reports current drug use. Frequency: 2.00 times per week. Drug: Marijuana.  No Known Allergies  No family history on file.  Prior to Admission medications   Not on  File    Physical Exam: Vitals:   02/22/23 2007 02/22/23 2008  BP: (!) 151/111   Pulse: (!) 120   Resp: 18   Temp: 98.9 F (37.2 C)   TempSrc: Oral   SpO2: 97%   Weight:  70.8 kg  Height:  6' (1.829 m)   Physical Exam  Labs on Admission: I have personally reviewed following labs and imaging studies  CBC: Recent Labs  Lab 02/22/23 2010  WBC 18.5*  NEUTROABS 16.4*  HGB 14.7  HCT 41.0  MCV 97.6  PLT 203   Basic Metabolic Panel: Recent Labs  Lab 02/22/23 2010  NA 134*  K 3.2*  CL 98  CO2 23  GLUCOSE 118*  BUN 8  CREATININE 0.70  CALCIUM 9.2   GFR: Estimated Creatinine Clearance: 134 mL/min (by C-G formula based on SCr of 0.7 mg/dL). Liver Function Tests: No results for input(s): "AST", "ALT", "ALKPHOS", "BILITOT", "PROT", "ALBUMIN" in the last 168 hours. No results for input(s): "LIPASE", "AMYLASE" in the last 168 hours. No results for input(s): "AMMONIA" in the last 168 hours. Coagulation Profile: No results for input(s): "INR", "PROTIME" in the last 168 hours. Cardiac Enzymes: No results for input(s): "CKTOTAL", "CKMB", "CKMBINDEX", "TROPONINI" in the last 168 hours. BNP (last 3 results) No results for input(s): "PROBNP" in the last 8760 hours. HbA1C: No results for input(s): "HGBA1C" in the last 72 hours. CBG: No results for input(s): "GLUCAP" in the last 168 hours. Lipid Profile: No results for input(s): "CHOL", "HDL", "LDLCALC", "TRIG", "CHOLHDL", "LDLDIRECT" in the last 72 hours. Thyroid Function Tests: No results for input(s): "TSH", "T4TOTAL", "FREET4", "T3FREE", "  THYROIDAB" in the last 72 hours. Anemia Panel: No results for input(s): "VITAMINB12", "FOLATE", "FERRITIN", "TIBC", "IRON", "RETICCTPCT" in the last 72 hours. Urine analysis: No results found for: "COLORURINE", "APPEARANCEUR", "LABSPEC", "PHURINE", "GLUCOSEU", "HGBUR", "BILIRUBINUR", "KETONESUR", "PROTEINUR", "UROBILINOGEN", "NITRITE", "LEUKOCYTESUR"  Radiological Exams on  Admission: CT Maxillofacial W Contrast Result Date: 02/22/2023 CLINICAL DATA:  Sublingual/submandibular abscess EXAM: CT MAXILLOFACIAL WITH CONTRAST TECHNIQUE: Multidetector CT imaging of the maxillofacial structures was performed with intravenous contrast. Multiplanar CT image reconstructions were also generated. RADIATION DOSE REDUCTION: This exam was performed according to the departmental dose-optimization program which includes automated exposure control, adjustment of the mA and/or kV according to patient size and/or use of iterative reconstruction technique. CONTRAST:  OMNIPAQUE IOHEXOL 300 MG/ML  SOLN COMPARISON:  None Available. FINDINGS: Osseous: No fracture or mandibular dislocation. There are caries of the most posterior remaining mandibular and maxillary molars on the left. Orbits: Negative. No traumatic or inflammatory finding. Sinuses: There is diffuse moderate paranasal sinus opacification, sparing the sphenoid sinuses. Soft tissues: There is bilateral lower facial subcutaneous induration with thickening of the platysma and bilateral level 1A lymph nodes, the largest of which is on the left and measures 6 mm. Limited intracranial: No significant or unexpected finding. IMPRESSION: 1. Bilateral lower facial subcutaneous induration with thickening of the platysma and bilateral level 1A lymph nodes, consistent with cellulitis. No abscess. 2. Caries of the most posterior remaining mandibular and maxillary molars on the left. 3. Diffuse moderate paranasal sinus opacification, sparing the sphenoid sinuses. Electronically Signed   By: Deatra Robinson M.D.   On: 02/22/2023 23:02     Data Reviewed: Relevant notes from primary care and specialist visits, past discharge summaries as available in EHR, including Care Everywhere. Prior diagnostic testing as pertinent to current admission diagnoses Updated medications and problem lists for reconciliation ED course, including vitals, labs, imaging,  treatment and response to treatment Triage notes, nursing and pharmacy notes and ED provider's notes Notable results as noted in HPI   Assessment and Plan: * Facial cellulitis secondary to dental infection SIRS, possible sepsis Sepsis criteria include tachycardia and leukocytosis Unasyn and Decadron Pain control Follow blood cultures     DVT prophylaxis: Lovenox  Consults: none  Advance Care Planning: full code  Family Communication: none  Disposition Plan: Back to previous home environment  Severity of Illness: The appropriate patient status for this patient is INPATIENT. Inpatient status is judged to be reasonable and necessary in order to provide the required intensity of service to ensure the patient's safety. The patient's presenting symptoms, physical exam findings, and initial radiographic and laboratory data in the context of their chronic comorbidities is felt to place them at high risk for further clinical deterioration. Furthermore, it is not anticipated that the patient will be medically stable for discharge from the hospital within 2 midnights of admission.   * I certify that at the point of admission it is my clinical judgment that the patient will require inpatient hospital care spanning beyond 2 midnights from the point of admission due to high intensity of service, high risk for further deterioration and high frequency of surveillance required.*  Author: Andris Baumann, MD 02/22/2023 11:52 PM  For on call review [email or web address].

## 2023-02-22 NOTE — ED Notes (Addendum)
Pain started Tuesday - progressively worsening. Difficulty swallowing.  Swelling left side of face.

## 2023-02-22 NOTE — ED Triage Notes (Signed)
Patient C/O Left lower dental pain and left jaw pain and swelling that began on Tuesday. Patient believes he may have an abscess tooth. Patient states he has had fevers at home, but they have resolved at this time. Denies nausea and vomiting.

## 2023-02-22 NOTE — Progress Notes (Signed)
Pharmacy Antibiotic Note  Darren Hutchinson is a 33 y.o. male admitted on 02/22/2023 with cellulitis.  Pharmacy has been consulted for Unasyn dosing.  Plan: Unasyn 3 gm IV X 1 given in ED on 1/18 @ 2338. Unasyn 3 gm IV Q6H ordered to start on 1/19 @ 0600.   Height: 6' (182.9 cm) Weight: 70.8 kg (156 lb) IBW/kg (Calculated) : 77.6  Temp (24hrs), Avg:98.9 F (37.2 C), Min:98.9 F (37.2 C), Max:98.9 F (37.2 C)  Recent Labs  Lab 02/22/23 2010  WBC 18.5*  CREATININE 0.70    Estimated Creatinine Clearance: 134 mL/min (by C-G formula based on SCr of 0.7 mg/dL).    No Known Allergies  Antimicrobials this admission:   >>    >>   Dose adjustments this admission:   Microbiology results:  BCx:   UCx:    Sputum:    MRSA PCR:   Thank you for allowing pharmacy to be a part of this patient's care.  Dolorez Jeffrey D 02/22/2023 11:54 PM

## 2023-02-22 NOTE — ED Provider Notes (Signed)
[facility] Provider Note    Event Date/Time   First MD Initiated Contact with Patient 02/22/23 2138     (approximate)   History   Jaw Pain and Dental Pain   HPI  Darren Hutchinson is a 33 y.o. male with no significant past medical history who presents with dental pain and left sided facial swelling over the last 4 days, gradual onset, associated with difficulty opening his mouth fully and decreased appetite.  The patient has noted subjective intermittent fevers and chills.  He states that he has a history of poor dentition and has had dental infections and abscesses before.  The patient has taken ibuprofen with minimal relief.  I reviewed the past medical records.  He was seen in the ED in 2022 for shoulder pain.  Previously, his most recent outpatient encounter was with internal medicine in 2021 for follow-up of a hand injury.   Physical Exam   Triage Vital Signs: ED Triage Vitals  Encounter Vitals Group     BP 02/22/23 2007 (!) 151/111     Systolic BP Percentile --      Diastolic BP Percentile --      Pulse Rate 02/22/23 2007 (!) 120     Resp 02/22/23 2007 18     Temp 02/22/23 2007 98.9 F (37.2 C)     Temp Source 02/22/23 2007 Oral     SpO2 02/22/23 2007 97 %     Weight 02/22/23 2008 156 lb (70.8 kg)     Height 02/22/23 2008 6' (1.829 m)     Head Circumference --      Peak Flow --      Pain Score 02/22/23 2008 7     Pain Loc --      Pain Education --      Exclude from Growth Chart --     Most recent vital signs: Vitals:   02/22/23 2007  BP: (!) 151/111  Pulse: (!) 120  Resp: 18  Temp: 98.9 F (37.2 C)  SpO2: 97%     General: Awake, no distress.  CV:  Good peripheral perfusion.  Resp:  Normal effort.  Abd:  No distention.  Other:  Mild trismus; patient is able to open mouth approximately 3 finger breadths.  Tenderness to left posterior molar with no palpable fluctuance or drainage.  Left buccal and submandibular swelling and  tenderness, difficult to visualize due to the patient's large beard.   ED Results / Procedures / Treatments   Labs (all labs ordered are listed, but only abnormal results are displayed) Labs Reviewed  CBC WITH DIFFERENTIAL/PLATELET - Abnormal; Notable for the following components:      Result Value   WBC 18.5 (*)    RBC 4.20 (*)    MCH 35.0 (*)    Neutro Abs 16.4 (*)    Abs Immature Granulocytes 0.11 (*)    All other components within normal limits  BASIC METABOLIC PANEL - Abnormal; Notable for the following components:   Sodium 134 (*)    Potassium 3.2 (*)    Glucose, Bld 118 (*)    All other components within normal limits  LACTIC ACID, PLASMA  LACTIC ACID, PLASMA     EKG     RADIOLOGY  CT maxillofacial:   IMPRESSION:  1. Bilateral lower facial subcutaneous induration with thickening of  the platysma and bilateral level 1A lymph nodes, consistent with  cellulitis. No abscess.  2. Caries of the most posterior remaining mandibular  and maxillary  molars on the left.  3. Diffuse moderate paranasal sinus opacification, sparing the  sphenoid sinuses.    PROCEDURES:  Critical Care performed: No  Procedures   MEDICATIONS ORDERED IN ED: Medications  Ampicillin-Sulbactam (UNASYN) 3 g in sodium chloride 0.9 % 100 mL IVPB (3 g Intravenous New Bag/Given 02/22/23 2338)  dexamethasone (DECADRON) injection 8 mg (has no administration in time range)  traMADol (ULTRAM) tablet 50 mg (50 mg Oral Given 02/22/23 2208)  iohexol (OMNIPAQUE) 300 MG/ML solution 100 mL (100 mLs Intravenous Contrast Given 02/22/23 2228)  dexamethasone (DECADRON) injection 6 mg (6 mg Intravenous Given 02/22/23 2330)  sodium chloride 0.9 % bolus 1,000 mL (1,000 mLs Intravenous Bolus from Bag 02/22/23 2334)     IMPRESSION / MDM / ASSESSMENT AND PLAN / ED COURSE  I reviewed the triage vital signs and the nursing notes.  33 year old male with no active medical problems, not on any medication, presents  with left lower dental pain over the last several days associated with facial and submandibular swelling and subjective fevers and chills.  On exam the patient is hypertensive and somewhat tachycardic but afebrile.  Exam is otherwise as described above.  Differential diagnosis includes, but is not limited to, dental infection, facial cellulitis, phlegmon, abscess, parotitis, Ludwig angina.  Patient's presentation is most consistent with acute presentation with potential threat to life or bodily function.  CBC significant for leukocytosis.  BMP shows no acute findings.  We will obtain a CT maxillofacial for further evaluation.  ----------------------------------------- 11:37 PM on 02/22/2023 -----------------------------------------  CT shows facial cellulitis with no evidence of abscess.  Given the tachycardia, leukocytosis, and trismus, the patient will need admission for IV antibiotics.  I have ordered Zosyn as well as steroid for swelling.  At this time there is no evidence of Ludwig angina.  I consulted Dr. Para March from the hospitalist service; based on our discussion she agrees to evaluate the patient for admission.   FINAL CLINICAL IMPRESSION(S) / ED DIAGNOSES   Final diagnoses:  Facial cellulitis     Rx / DC Orders   ED Discharge Orders     None        Note:  This document was prepared using Dragon voice recognition software and may include unintentional dictation errors.    Dionne Bucy, MD 02/22/23 [phone]

## 2023-02-22 NOTE — Assessment & Plan Note (Signed)
SIRS, possible sepsis Sepsis criteria include tachycardia and leukocytosis Unasyn and Decadron Pain control Follow blood cultures

## 2023-02-23 DIAGNOSIS — K047 Periapical abscess without sinus: Secondary | ICD-10-CM

## 2023-02-23 DIAGNOSIS — R6510 Systemic inflammatory response syndrome (SIRS) of non-infectious origin without acute organ dysfunction: Secondary | ICD-10-CM

## 2023-02-23 DIAGNOSIS — E876 Hypokalemia: Secondary | ICD-10-CM | POA: Insufficient documentation

## 2023-02-23 LAB — BASIC METABOLIC PANEL
Anion gap: 12 (ref 5–15)
BUN: 6 mg/dL (ref 6–20)
CO2: 22 mmol/L (ref 22–32)
Calcium: 8.9 mg/dL (ref 8.9–10.3)
Chloride: 101 mmol/L (ref 98–111)
Creatinine, Ser: 0.68 mg/dL (ref 0.61–1.24)
GFR, Estimated: >60 mL/min (ref >60)
Glucose, Bld: 180 mg/dL — ABNORMAL HIGH (ref 70–99)
Potassium: 4.4 mmol/L (ref 3.5–5.1)
Sodium: 135 mmol/L (ref 135–145)

## 2023-02-23 LAB — HIV ANTIBODY (ROUTINE TESTING W REFLEX): HIV Screen 4th Generation wRfx: NONREACTIVE

## 2023-02-23 LAB — CBC
HCT: 40.8 % (ref 39.0–52.0)
Hemoglobin: 14.6 g/dL (ref 13.0–17.0)
MCH: 34.2 pg — ABNORMAL HIGH (ref 26.0–34.0)
MCHC: 35.8 g/dL (ref 30.0–36.0)
MCV: 95.6 fL (ref 80.0–100.0)
Platelets: 210 10*3/uL (ref 150–400)
RBC: 4.27 MIL/uL (ref 4.22–5.81)
RDW: 11.6 % (ref 11.5–15.5)
WBC: 24.5 10*3/uL — ABNORMAL HIGH (ref 4.0–10.5)
nRBC: 0 % (ref 0.0–0.2)

## 2023-02-23 LAB — LACTIC ACID, PLASMA: Lactic Acid, Venous: 0.8 mmol/L (ref 0.5–1.9)

## 2023-02-23 MED ORDER — POTASSIUM CHLORIDE 20 MEQ PO PACK
40.0000 meq | PACK | Freq: Once | ORAL | Status: AC
  Administered 2023-02-23: 40 meq via ORAL
  Filled 2023-02-23: qty 2

## 2023-02-23 MED ORDER — KETOROLAC TROMETHAMINE 30 MG/ML IJ SOLN: 30.0000 mg | Freq: Four times a day (QID) | INTRAMUSCULAR | Status: DC

## 2023-02-23 MED ORDER — IBUPROFEN 400 MG PO TABS
600.0000 mg | ORAL_TABLET | Freq: Four times a day (QID) | ORAL | Status: DC
  Administered 2023-02-23 – 2023-02-24 (×4): 600 mg via ORAL
  Filled 2023-02-23 (×4): qty 2

## 2023-02-23 MED ORDER — HYDROCODONE-ACETAMINOPHEN 5-325 MG PO TABS
1.0000 | ORAL_TABLET | ORAL | Status: DC | PRN
  Administered 2023-02-23 (×2): 1 via ORAL
  Filled 2023-02-23 (×2): qty 1

## 2023-02-23 MED ORDER — NICOTINE 21 MG/24HR TD PT24
21.0000 mg | MEDICATED_PATCH | Freq: Every day | TRANSDERMAL | Status: DC
  Administered 2023-02-23 – 2023-02-24 (×2): 21 mg via TRANSDERMAL
  Filled 2023-02-23 (×2): qty 1

## 2023-02-23 NOTE — Plan of Care (Signed)

## 2023-02-23 NOTE — Progress Notes (Signed)
1      PROGRESS NOTE    Darren Hutchinson  JXB:[id number] DOB: [birth year] DOA: 02/22/2023 PCP: Pcp, No    Brief Narrative:   33 y.o. male with medical history significant for Dental caries and prior dental abscesses who presents to the ED with a 4-day history of progressively worsening pain in the left jaw associated with with left-sided facial swelling   1/19: Discontinue telemetry, adding scheduled ibuprofen discontinued Toradol, added nicotine patch per patient request  Assessment & Plan:   Principal Problem:   Facial cellulitis secondary to dental infection Active Problems:   Dental infection   SIRS (systemic inflammatory response syndrome) (HCC)   Hypokalemia   * Facial cellulitis secondary to dental infection Sepsis ruled out, normal lactate Continue Unasyn and Decadron Pain control.  I have added scheduled ibuprofen and discontinue Toradol. Negative blood cultures Will need outpatient dental/ENT evaluation   Hypokalemia Potassium repletion given   DVT prophylaxis: Does not need.  He is young and ambulatory     Code Status: Full code Family Communication: Mother at bedside updated Disposition Plan: Likely discharge in next 1 to 2 days depending on clinical condition    Antimicrobials:  Unasyn   Subjective:  Jaw pain somewhat improved but having some swelling around throat region.  He is able to drink liquid and hoping to eat some solids today   Objective: Vitals:   02/23/23 0100 02/23/23 0348 02/23/23 0515 02/23/23 0926  BP: (!) 140/88  139/82 119/89  Pulse: (!) 115  (!) 112 87  Resp: 18  18 16   Temp: 100 F (37.8 C) 98.8 F (37.1 C)  98.3 F (36.8 C)  TempSrc:  Oral  Oral  SpO2: 98%  100% 98%  Weight:      Height:        Intake/Output Summary (Last 24 hours) at 02/23/2023 1130 Last data filed at 02/23/2023 0015 Gross per 24 hour  Intake 1100 ml  Output --  Net 1100 ml   Filed Weights   02/22/23 2008  Weight: 70.8 kg     Examination:  General exam: Appears calm and comfortable  Oral cavity: Dental caries present, tenderness/swelling around submental area Respiratory system: Clear to auscultation. Respiratory effort normal. Cardiovascular system: S1 & S2 heard, RRR. No JVD, murmurs, rubs, gallops or clicks. No pedal edema. Gastrointestinal system: Abdomen is nondistended, soft and nontender. No organomegaly or masses felt. Normal bowel sounds heard. Central nervous system: Alert and oriented. No focal neurological deficits. Extremities: Symmetric 5 x 5 power. Skin: No rashes, lesions or ulcers Psychiatry: Judgement and insight appear normal. Mood & affect appropriate.     Data Reviewed: I have personally reviewed following labs and imaging studies  CBC: Recent Labs  Lab 02/22/23 2010 02/23/23 0314  WBC 18.5* 24.5*  NEUTROABS 16.4*  --   HGB 14.7 14.6  HCT 41.0 40.8  MCV 97.6 95.6  PLT 203 210   Basic Metabolic Panel: Recent Labs  Lab 02/22/23 2010 02/23/23 0314  NA 134* 135  K 3.2* 4.4  CL 98 101  CO2 23 22  GLUCOSE 118* 180*  BUN 8 6  CREATININE 0.70 0.68  CALCIUM 9.2 8.9    Sepsis Labs: Recent Labs  Lab 02/22/23 2327  LATICACIDVEN 0.8    No results found for this or any previous visit (from the past 240 hours).       Radiology Studies: CT Maxillofacial W Contrast Result Date: 02/22/2023 CLINICAL DATA:  Sublingual/submandibular abscess EXAM: CT MAXILLOFACIAL WITH  CONTRAST TECHNIQUE: Multidetector CT imaging of the maxillofacial structures was performed with intravenous contrast. Multiplanar CT image reconstructions were also generated. RADIATION DOSE REDUCTION: This exam was performed according to the departmental dose-optimization program which includes automated exposure control, adjustment of the mA and/or kV according to patient size and/or use of iterative reconstruction technique. CONTRAST:  OMNIPAQUE IOHEXOL 300 MG/ML  SOLN COMPARISON:  None Available.  FINDINGS: Osseous: No fracture or mandibular dislocation. There are caries of the most posterior remaining mandibular and maxillary molars on the left. Orbits: Negative. No traumatic or inflammatory finding. Sinuses: There is diffuse moderate paranasal sinus opacification, sparing the sphenoid sinuses. Soft tissues: There is bilateral lower facial subcutaneous induration with thickening of the platysma and bilateral level 1A lymph nodes, the largest of which is on the left and measures 6 mm. Limited intracranial: No significant or unexpected finding. IMPRESSION: 1. Bilateral lower facial subcutaneous induration with thickening of the platysma and bilateral level 1A lymph nodes, consistent with cellulitis. No abscess. 2. Caries of the most posterior remaining mandibular and maxillary molars on the left. 3. Diffuse moderate paranasal sinus opacification, sparing the sphenoid sinuses. Electronically Signed   By: Deatra Robinson M.D.   On: 02/22/2023 23:02        Scheduled Meds:  dexamethasone (DECADRON) injection  8 mg Intravenous Q8H   ibuprofen  600 mg Oral QID   nicotine  21 mg Transdermal Daily   Continuous Infusions:  ampicillin-sulbactam (UNASYN) IV 3 g (02/23/23 0634)     LOS: 1 day    Time spent: 35 minutes    [name] Sherryll Burger, MD [facility] Pager [phone]  If 7PM-7AM, please contact night-coverage [email or web address]  02/23/2023, 11:30 AM

## 2023-02-23 NOTE — Plan of Care (Signed)

## 2023-02-23 NOTE — ED Notes (Signed)
ED TO INPATIENT HANDOFF REPORT  ED Nurse Name and Phone #: [phone]  S Name/Age/Gender Darren Hutchinson 33 y.o. male Room/Bed: ED51A/ED51A  Code Status   Code Status: Full Code  Home/SNF/Other Home Patient oriented to: self, place, time, and situation Is this baseline? Yes   Triage Complete: Triage complete  Chief Complaint Facial cellulitis [L03.211]  Triage Note Patient C/O Left lower dental pain and left jaw pain and swelling that began on Tuesday. Patient believes he may have an abscess tooth. Patient states he has had fevers at home, but they have resolved at this time. Denies nausea and vomiting.    Allergies No Known Allergies  Level of Care/Admitting Diagnosis ED Disposition     ED Disposition  Admit   Condition  --   Comment  Hospital Area: [facility] [[id number]]  Level of Care: Med-Surg [16]  Covid Evaluation: Asymptomatic - no recent exposure (last 10 days) testing not required  Diagnosis: Facial cellulitis [161096]  Admitting Physician: Andris Baumann [[id number]]  Attending Physician: Andris Baumann [[id number]]  Certification:: I certify this patient will need inpatient services for at least 2 midnights  Expected Medical Readiness: 02/24/2023          B Medical/Surgery History No past medical history on file. Past Surgical History:  Procedure Laterality Date   APPENDECTOMY       A IV Location/Drains/Wounds Patient Lines/Drains/Airways Status     Active Line/Drains/Airways     Name Placement date Placement time Site Days   Peripheral IV 02/22/23 20 G Right Antecubital 02/22/23  2216  Antecubital  1            Intake/Output Last 24 hours  Intake/Output Summary (Last 24 hours) at 02/23/2023 0023 Last data filed at 02/23/2023 0015 Gross per 24 hour  Intake 1100 ml  Output --  Net 1100 ml    Labs/Imaging Results for orders placed or performed during the hospital encounter of 02/22/23 (from the past 48 hours)  CBC  with Differential     Status: Abnormal   Collection Time: 02/22/23  8:10 PM  Result Value Ref Range   WBC 18.5 (H) 4.0 - 10.5 K/uL   RBC 4.20 (L) 4.22 - 5.81 MIL/uL   Hemoglobin 14.7 13.0 - 17.0 g/dL   HCT 29.5 62.1 - 30.8 %   MCV 97.6 80.0 - 100.0 fL   MCH 35.0 (H) 26.0 - 34.0 pg   MCHC 35.9 30.0 - 36.0 g/dL   RDW 65.7 84.6 - 96.2 %   Platelets 203 150 - 400 K/uL   nRBC 0.0 0.0 - 0.2 %   Neutrophils Relative % 88 %   Neutro Abs 16.4 (H) 1.7 - 7.7 K/uL   Lymphocytes Relative 5 %   Lymphs Abs 0.9 0.7 - 4.0 K/uL   Monocytes Relative 5 %   Monocytes Absolute 0.8 0.1 - 1.0 K/uL   Eosinophils Relative 1 %   Eosinophils Absolute 0.2 0.0 - 0.5 K/uL   Basophils Relative 0 %   Basophils Absolute 0.1 0.0 - 0.1 K/uL   Immature Granulocytes 1 %   Abs Immature Granulocytes 0.11 (H) 0.00 - 0.07 K/uL    Comment: Performed at [facility], [address] [place], [place] [address]  Basic metabolic panel     Status: Abnormal   Collection Time: 02/22/23  8:10 PM  Result Value Ref Range   Sodium 134 (L) 135 - 145 mmol/L   Potassium 3.2 (L) 3.5 - 5.1 mmol/L  Chloride 98 98 - 111 mmol/L   CO2 23 22 - 32 mmol/L   Glucose, Bld 118 (H) 70 - 99 mg/dL    Comment: Glucose reference range applies only to samples taken after fasting for at least 8 hours.   BUN 8 6 - 20 mg/dL   Creatinine, Ser 4.78 0.61 - 1.24 mg/dL   Calcium 9.2 8.9 - 29.5 mg/dL   GFR, Estimated >62 >13 mL/min    Comment: (NOTE) Calculated using the CKD-EPI Creatinine Equation (2021)    Anion gap 13 5 - 15    Comment: Performed at [facility], [address] [place], [place] [address]  Lactic acid, plasma     Status: None   Collection Time: 02/22/23 11:27 PM  Result Value Ref Range   Lactic Acid, Venous 0.8 0.5 - 1.9 mmol/L    Comment: Performed at [facility], [address] [place], [place] [address]   CT Maxillofacial W Contrast Result Date: 02/22/2023 CLINICAL DATA:   Sublingual/submandibular abscess EXAM: CT MAXILLOFACIAL WITH CONTRAST TECHNIQUE: Multidetector CT imaging of the maxillofacial structures was performed with intravenous contrast. Multiplanar CT image reconstructions were also generated. RADIATION DOSE REDUCTION: This exam was performed according to the departmental dose-optimization program which includes automated exposure control, adjustment of the mA and/or kV according to patient size and/or use of iterative reconstruction technique. CONTRAST:  OMNIPAQUE IOHEXOL 300 MG/ML  SOLN COMPARISON:  None Available. FINDINGS: Osseous: No fracture or mandibular dislocation. There are caries of the most posterior remaining mandibular and maxillary molars on the left. Orbits: Negative. No traumatic or inflammatory finding. Sinuses: There is diffuse moderate paranasal sinus opacification, sparing the sphenoid sinuses. Soft tissues: There is bilateral lower facial subcutaneous induration with thickening of the platysma and bilateral level 1A lymph nodes, the largest of which is on the left and measures 6 mm. Limited intracranial: No significant or unexpected finding. IMPRESSION: 1. Bilateral lower facial subcutaneous induration with thickening of the platysma and bilateral level 1A lymph nodes, consistent with cellulitis. No abscess. 2. Caries of the most posterior remaining mandibular and maxillary molars on the left. 3. Diffuse moderate paranasal sinus opacification, sparing the sphenoid sinuses. Electronically Signed   By: Deatra Robinson M.D.   On: 02/22/2023 23:02    Pending Labs Unresulted Labs (From admission, onward)     Start     Ordered   02/22/23 2355  HIV Antibody (routine testing w rflx)  (HIV Antibody (Routine testing w reflex) panel)  Once,   R        02/22/23 2355   02/22/23 2319  Lactic acid, plasma  Now then every 2 hours,   STAT      02/22/23 2318            Vitals/Pain Today's Vitals   02/22/23 2008 02/22/23 2200 02/23/23 0019  02/23/23 0021  BP:      Pulse:      Resp:      Temp:    99.3 F (37.4 C)  TempSrc:    Oral  SpO2:      Weight: 70.8 kg     Height: 6' (1.829 m)     PainSc: 7  8  8       Isolation Precautions No active isolations  Medications Medications  dexamethasone (DECADRON) injection 8 mg (has no administration in time range)  Ampicillin-Sulbactam (UNASYN) 3 g in sodium chloride 0.9 % 100 mL IVPB (has no administration in time range)  acetaminophen (TYLENOL) tablet 650  mg (has no administration in time range)    Or  acetaminophen (TYLENOL) suppository 650 mg (has no administration in time range)  ondansetron (ZOFRAN) tablet 4 mg (has no administration in time range)    Or  ondansetron (ZOFRAN) injection 4 mg (has no administration in time range)  HYDROcodone-acetaminophen (NORCO/VICODIN) 5-325 MG per tablet 1-2 tablet (1 tablet Oral Given 02/23/23 0019)  ketorolac (TORADOL) 30 MG/ML injection 30 mg (has no administration in time range)  morphine (PF) 2 MG/ML injection 2 mg (has no administration in time range)  traMADol (ULTRAM) tablet 50 mg (50 mg Oral Given 02/22/23 2208)  iohexol (OMNIPAQUE) 300 MG/ML solution 100 mL (100 mLs Intravenous Contrast Given 02/22/23 2228)  dexamethasone (DECADRON) injection 6 mg (6 mg Intravenous Given 02/22/23 2330)  sodium chloride 0.9 % bolus 1,000 mL (0 mLs Intravenous Stopped 02/23/23 0015)  Ampicillin-Sulbactam (UNASYN) 3 g in sodium chloride 0.9 % 100 mL IVPB (0 g Intravenous Stopped 02/23/23 0015)    Mobility walks     Focused Assessments     R Recommendations: See Admitting Provider Note  Report given to:   Additional Notes:

## 2023-02-23 NOTE — Progress Notes (Signed)
Pt has no tele ordered. Pt HR 120 and temp at 100 rest of VSS. NP Jon Billings made aware but no new order place. Will continue to monitor.

## 2023-02-23 NOTE — Assessment & Plan Note (Signed)
Potassium repletion given

## 2023-02-24 LAB — CBC
HCT: 39.4 % (ref 39.0–52.0)
Hemoglobin: 13.9 g/dL (ref 13.0–17.0)
MCH: 34.1 pg — ABNORMAL HIGH (ref 26.0–34.0)
MCHC: 35.3 g/dL (ref 30.0–36.0)
MCV: 96.6 fL (ref 80.0–100.0)
Platelets: 214 10*3/uL (ref 150–400)
RBC: 4.08 MIL/uL — ABNORMAL LOW (ref 4.22–5.81)
RDW: 11.5 % (ref 11.5–15.5)
WBC: 24 10*3/uL — ABNORMAL HIGH (ref 4.0–10.5)
nRBC: 0 % (ref 0.0–0.2)

## 2023-02-24 LAB — BASIC METABOLIC PANEL
Anion gap: 10 (ref 5–15)
BUN: 14 mg/dL (ref 6–20)
CO2: 25 mmol/L (ref 22–32)
Calcium: 8.9 mg/dL (ref 8.9–10.3)
Chloride: 99 mmol/L (ref 98–111)
Creatinine, Ser: 0.62 mg/dL (ref 0.61–1.24)
GFR, Estimated: >60 mL/min (ref >60)
Glucose, Bld: 149 mg/dL — ABNORMAL HIGH (ref 70–99)
Potassium: 4 mmol/L (ref 3.5–5.1)
Sodium: 134 mmol/L — ABNORMAL LOW (ref 135–145)

## 2023-02-24 MED ORDER — AMOXICILLIN-POT CLAVULANATE 500-125 MG PO TABS: 1.0000 | ORAL_TABLET | Freq: Three times a day (TID) | ORAL | 0 refills | Status: AC

## 2023-02-24 MED ORDER — ACETAMINOPHEN 325 MG PO TABS: 650.0000 mg | ORAL_TABLET | Freq: Four times a day (QID) | ORAL | 0 refills | Status: AC | PRN

## 2023-02-24 MED ORDER — PREDNISONE 10 MG (21) PO TBPK: ORAL_TABLET | ORAL | 0 refills | Status: DC

## 2023-02-24 NOTE — Discharge Summary (Signed)
Physician Discharge Summary   Patient: Darren Hutchinson MRN: [id number] DOB: Aug 29, 1990  Admit date:     02/22/2023  Discharge date: 02/24/23  Discharge Physician: Delfino Lovett   PCP: Pcp, No   Recommendations at discharge:   Follow-up with outpatient providers and dentist as requested  Discharge Diagnoses: Principal Problem:   Facial cellulitis secondary to dental infection Active Problems:   Dental infection   SIRS (systemic inflammatory response syndrome) (HCC)   Hypokalemia  Hospital Course: Assessment and Plan:  * Facial cellulitis secondary to dental infection Sepsis ruled out, normal lactate Treated with IV Unasyn and Decadron while in the hospital Negative blood cultures Will need outpatient dental/ENT evaluation.  Patient is aware and in agreement   Hypokalemia Potassium repleted       Disposition: Home Diet recommendation:  Discharge Diet Orders (From admission, onward)     Start     Ordered   02/24/23 0000  Diet - low sodium heart healthy        02/24/23 0940           Carb modified diet DISCHARGE MEDICATION: Allergies as of 02/24/2023   No Known Allergies      Medication List     TAKE these medications    acetaminophen 325 MG tablet Commonly known as: TYLENOL Take 2 tablets (650 mg total) by mouth every 6 (six) hours as needed for up to 7 days for mild pain (pain score 1-3), moderate pain (pain score 4-6), fever or headache (or Fever >/= 101).   amoxicillin-clavulanate 500-125 MG tablet Commonly known as: Augmentin Take 1 tablet by mouth 3 (three) times daily for 7 days.   predniSONE 10 MG (21) Tbpk tablet Commonly known as: STERAPRED UNI-PAK 21 TAB Start 60 mg po daily, taper 10 mg daily until finish        Follow-up Information     [facility]. Schedule an appointment as soon as possible for a visit in 1 week(s).   Specialty: Primary Care Why: [facility] Discharge F/UP Clinic closed due to holiday, call  [phone]) tomorrow. Contact information: [address] [place] [address] [phone]               Discharge Exam: Ceasar Mons Weights   02/22/23 2008  Weight: 70.8 kg   General exam: Appears calm and comfortable  Oral cavity: Dental caries present, tenderness/swelling around submental area Respiratory system: Clear to auscultation. Respiratory effort normal. Cardiovascular system: S1 & S2 heard, RRR. No JVD, murmurs, rubs, gallops or clicks. No pedal edema. Gastrointestinal system: Abdomen is nondistended, soft and nontender. No organomegaly or masses felt. Normal bowel sounds heard. Central nervous system: Alert and oriented. No focal neurological deficits. Extremities: Symmetric 5 x 5 power. Skin: No rashes, lesions or ulcers Psychiatry: Judgement and insight appear normal. Mood & affect appropriate.   Condition at discharge: good  The results of significant diagnostics from this hospitalization (including imaging, microbiology, ancillary and laboratory) are listed below for reference.   Imaging Studies: CT Maxillofacial W Contrast Result Date: 02/22/2023 CLINICAL DATA:  Sublingual/submandibular abscess EXAM: CT MAXILLOFACIAL WITH CONTRAST TECHNIQUE: Multidetector CT imaging of the maxillofacial structures was performed with intravenous contrast. Multiplanar CT image reconstructions were also generated. RADIATION DOSE REDUCTION: This exam was performed according to the departmental dose-optimization program which includes automated exposure control, adjustment of the mA and/or kV according to patient size and/or use of iterative reconstruction technique. CONTRAST:  OMNIPAQUE IOHEXOL 300 MG/ML  SOLN  COMPARISON:  None Available. FINDINGS: Osseous: No fracture or mandibular dislocation. There are caries of the most posterior remaining mandibular and maxillary molars on the left. Orbits: Negative. No traumatic or inflammatory finding. Sinuses: There  is diffuse moderate paranasal sinus opacification, sparing the sphenoid sinuses. Soft tissues: There is bilateral lower facial subcutaneous induration with thickening of the platysma and bilateral level 1A lymph nodes, the largest of which is on the left and measures 6 mm. Limited intracranial: No significant or unexpected finding. IMPRESSION: 1. Bilateral lower facial subcutaneous induration with thickening of the platysma and bilateral level 1A lymph nodes, consistent with cellulitis. No abscess. 2. Caries of the most posterior remaining mandibular and maxillary molars on the left. 3. Diffuse moderate paranasal sinus opacification, sparing the sphenoid sinuses. Electronically Signed   By: Deatra Robinson M.D.   On: 02/22/2023 23:02    Microbiology: Results for orders placed or performed during the hospital encounter of 10/22/18  Blood culture (routine x 2)     Status: None   Collection Time: 10/22/18  8:05 PM   Specimen: BLOOD  Result Value Ref Range Status   Specimen Description BLOOD LEFT ANTECUBITAL  Final   Special Requests   Final    BOTTLES DRAWN AEROBIC AND ANAEROBIC Blood Culture adequate volume   Culture   Final    NO GROWTH 5 DAYS Performed at [facility], [address] [place], [place] [address]    Report Status 10/27/2018 FINAL  Final  Blood culture (routine x 2)     Status: None   Collection Time: 10/22/18  8:20 PM   Specimen: BLOOD  Result Value Ref Range Status   Specimen Description BLOOD RIGHT ANTECUBITAL  Final   Special Requests   Final    BOTTLES DRAWN AEROBIC AND ANAEROBIC Blood Culture adequate volume   Culture   Final    NO GROWTH 5 DAYS Performed at [facility], [address] [place], [place] [address]    Report Status 10/27/2018 FINAL  Final    Labs: CBC: Recent Labs  Lab 02/22/23 2010 02/23/23 0314 02/24/23 0359  WBC 18.5* 24.5* 24.0*  NEUTROABS 16.4*  --   --   HGB 14.7 14.6 13.9  HCT 41.0 40.8 39.4  MCV 97.6 95.6 96.6   PLT 203 210 214   Basic Metabolic Panel: Recent Labs  Lab 02/22/23 2010 02/23/23 0314 02/24/23 0359  NA 134* 135 134*  K 3.2* 4.4 4.0  CL 98 101 99  CO2 23 22 25   GLUCOSE 118* 180* 149*  BUN 8 6 14   CREATININE 0.70 0.68 0.62  CALCIUM 9.2 8.9 8.9   Liver Function Tests: No results for input(s): "AST", "ALT", "ALKPHOS", "BILITOT", "PROT", "ALBUMIN" in the last 168 hours. CBG: No results for input(s): "GLUCAP" in the last 168 hours.  Discharge time spent: greater than 30 minutes.  Signed: Delfino Lovett, MD [facility] 02/24/2023

## 2023-02-24 NOTE — Plan of Care (Signed)

## 2023-08-11 ENCOUNTER — Other Ambulatory Visit: Payer: Self-pay

## 2023-08-11 ENCOUNTER — Encounter: Payer: Self-pay | Admitting: *Deleted

## 2023-08-11 ENCOUNTER — Emergency Department
Admission: EM | Admit: 2023-08-11 | Discharge: 2023-08-11 | Disposition: A | Discharge status: Home / Self Care | Payer: Self-pay | Attending: Emergency Medicine | Admitting: Emergency Medicine

## 2023-08-11 ENCOUNTER — Emergency Department: Payer: Self-pay

## 2023-08-11 DIAGNOSIS — I10 Essential (primary) hypertension: Secondary | ICD-10-CM | POA: Insufficient documentation

## 2023-08-11 DIAGNOSIS — G8911 Acute pain due to trauma: Secondary | ICD-10-CM | POA: Insufficient documentation

## 2023-08-11 DIAGNOSIS — W11XXXA Fall on and from ladder, initial encounter: Secondary | ICD-10-CM | POA: Insufficient documentation

## 2023-08-11 DIAGNOSIS — M5442 Lumbago with sciatica, left side: Secondary | ICD-10-CM | POA: Insufficient documentation

## 2023-08-11 MED ORDER — HYDROCODONE-ACETAMINOPHEN 5-325 MG PO TABS
1.0000 | ORAL_TABLET | Freq: Once | ORAL | Status: AC
  Administered 2023-08-11: 1 via ORAL
  Filled 2023-08-11: qty 1

## 2023-08-11 MED ORDER — CYCLOBENZAPRINE HCL 10 MG PO TABS: 10.0000 mg | ORAL_TABLET | Freq: Three times a day (TID) | ORAL | 0 refills | Status: AC | PRN

## 2023-08-11 MED ORDER — PREDNISONE 10 MG (21) PO TBPK: ORAL_TABLET | ORAL | 0 refills | Status: AC

## 2023-08-11 MED ORDER — PREDNISONE 20 MG PO TABS
60.0000 mg | ORAL_TABLET | Freq: Once | ORAL | Status: AC
  Administered 2023-08-11: 60 mg via ORAL
  Filled 2023-08-11: qty 3

## 2023-08-11 NOTE — ED Provider Notes (Signed)
[facility] Provider Note    Event Date/Time   First MD Initiated Contact with Patient 08/11/23 2135     (approximate)   History   Fall   HPI  Darren Hutchinson is a 33 y.o. male with no significant past medical history and as listed in EMR presents to the emergency department for treatment and evaluation of low back pain, left hip and left leg pain after falling off a ladder 2 days ago.  He estimates he fell approximately 8 feet.  He denies loss of consciousness.  He has no upper back or neck pain.  Pain in the left lower back radiates into the left leg.  Some relief with Bayer back and body.     Physical Exam    Vitals:   08/11/23 2021  BP: (!) 147/101  Pulse: 81  Resp: 18  Temp: 98.6 F (37 C)  SpO2: 100%    General: Awake, no distress.  CV:  Good peripheral perfusion.  Resp:  Normal effort.  Abd:  No distention.  Other:  No noted shortening or rotation of the left lower extremity.  Patient able to reposition in bed without assistance.  Neurovascularly intact.   ED Results / Procedures / Treatments   Labs (all labs ordered are listed, but only abnormal results are displayed)  Labs Reviewed - No data to display   EKG  Not indicated   RADIOLOGY  Image and radiology report reviewed and interpreted by me. Radiology report consistent with the same.  X-ray of lumbar spine, left hip, and left femur negative for acute fracture.  PROCEDURES:  Critical Care performed: No  Procedures   MEDICATIONS ORDERED IN ED:  Medications  predniSONE  (DELTASONE ) tablet 60 mg (has no administration in time range)  HYDROcodone -acetaminophen  (NORCO/VICODIN) 5-325 MG per tablet 1 tablet (has no administration in time range)     IMPRESSION / MDM / ASSESSMENT AND PLAN / ED COURSE   I have reviewed the triage note and vital signs. Vital signs indicate hypertension, otherwise normal   Differential diagnosis includes, but is not limited to,  vertebral fracture lumbar spine, hip fracture, femur fracture, musculoskeletal strain, contusion  Patient's presentation is most consistent with acute illness / injury with system symptoms.  33 year old male presenting to the emergency department for treatment and evaluation 2 days after falling off a ladder.  He states he landed directly on his bottom and then onto his back. Pain shoots down the left leg to the ankle. Similar sensation with previous sciatica.  Plan will be to treat him with prednisone  and Flexeril .  He is to follow-up with primary care or return to the emergency department for symptoms that change, worsen, or for new concerns.      FINAL CLINICAL IMPRESSION(S) / ED DIAGNOSES   Final diagnoses:  Acute back pain with sciatica, left     Rx / DC Orders   ED Discharge Orders          Ordered    predniSONE  (STERAPRED UNI-PAK 21 TAB) 10 MG (21) TBPK tablet        08/11/23 2235    cyclobenzaprine  (FLEXERIL ) 10 MG tablet  3 times daily PRN        08/11/23 2235             Note:  This document was prepared using Dragon voice recognition software and may include unintentional dictation errors.   Herlinda Kirk NOVAK, FNP 08/11/23 2330    Willo Dunnings, MD  08/11/23 2340  

## 2023-08-11 NOTE — ED Triage Notes (Signed)
 Pt to triage via wheelchair.  Pt has left hip and left leg pain after falling off a ladder 2 days ago.   Pt fell approx 8 feet off a ladder.  No neck pain.  No loc.  Pt has lower back pain.  Pt alert  speech clear.

## 2023-08-11 NOTE — Discharge Instructions (Signed)
 Follow-up with primary care or return to the emergency department for symptoms of change or worsen.

## 2023-08-11 NOTE — ED Notes (Signed)
 Pt states he was cleaning the gutters when he fell backwards from a ladder. The pt denied hitting his head or having any LOC before/during or after the fall.  The pt complains of hip and lower back pain at this time.
